# Patient Record
Sex: Female | Born: 1980 | Race: White | Hispanic: No | Marital: Married | Smoking: Never smoker
Health system: Southern US, Community
[De-identification: ages and names within clinical notes are randomized; demographics above are authoritative.]

## PROBLEM LIST (undated history)

## (undated) DIAGNOSIS — Z789 Other specified health status: Secondary | ICD-10-CM

## (undated) HISTORY — PX: OTHER SURGICAL HISTORY: SHX169

---
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
## 2000-03-23 ENCOUNTER — Encounter: Admission: RE | Admit: 2000-03-23 | Discharge: 2000-03-23 | Payer: Self-pay | Admitting: Urology

## 2000-03-23 ENCOUNTER — Encounter: Payer: Self-pay | Admitting: Urology

## 2002-02-21 ENCOUNTER — Encounter: Payer: Self-pay | Admitting: Urology

## 2002-02-21 ENCOUNTER — Ambulatory Visit (HOSPITAL_COMMUNITY): Admission: RE | Admit: 2002-02-21 | Discharge: 2002-02-21 | Payer: Self-pay | Admitting: Urology

## 2010-07-05 ENCOUNTER — Emergency Department (HOSPITAL_COMMUNITY): Admission: EM | Admit: 2010-07-05 | Discharge: 2010-07-06 | Payer: Self-pay | Admitting: Emergency Medicine

## 2010-10-28 LAB — URINALYSIS, ROUTINE W REFLEX MICROSCOPIC
Bilirubin Urine: NEGATIVE
Glucose, UA: NEGATIVE mg/dL
Ketones, ur: NEGATIVE mg/dL
Leukocytes, UA: NEGATIVE
Nitrite: NEGATIVE
Protein, ur: NEGATIVE mg/dL
Specific Gravity, Urine: 1.005 (ref 1.005–1.030)
Urobilinogen, UA: 0.2 mg/dL (ref 0.0–1.0)
pH: 6 (ref 5.0–8.0)

## 2010-10-28 LAB — URINE MICROSCOPIC-ADD ON

## 2010-10-28 LAB — POCT PREGNANCY, URINE: Preg Test, Ur: NEGATIVE

## 2012-05-30 LAB — OB RESULTS CONSOLE RPR: RPR: NONREACTIVE

## 2012-05-30 LAB — OB RESULTS CONSOLE ABO/RH: RH Type: POSITIVE

## 2012-05-30 LAB — OB RESULTS CONSOLE HIV ANTIBODY (ROUTINE TESTING): HIV: NONREACTIVE

## 2012-05-30 LAB — OB RESULTS CONSOLE RUBELLA ANTIBODY, IGM: Rubella: IMMUNE

## 2012-11-09 ENCOUNTER — Encounter (HOSPITAL_COMMUNITY): Payer: Self-pay | Admitting: Pharmacist

## 2012-11-12 NOTE — H&P (Signed)
Tami Hernandez is a 32 y.o. female G2P0010 at 36+weeks (EDD 12/09/12 by known conception with IVF/ICSI) with di/di twins presenting for scheduled c-section given a finding of IUGR in twin B of <10%ile and discordance of 28%. Dopplers and NST's have remained reassuring.  Twins are vertex transverse and patient elects c-section for malposition of twins.  Other than monitoring growth of the twins, the prenatal care has been non-eventful.  Pt d/w MFM who recommended delivery at 36-37 weeks.  Maternal Medical History:  Fetal activity: Perceived fetal activity is normal.    Prenatal complications: IUGR.   Prenatal Complications - Diabetes: none.    Past Ob Hx EAB 2007  Past Medical History  Diagnosis Date  . Medical history non-contributory    Past Surgical History  Procedure Laterality Date  . Egg harvesting     Family History: family history is not on file. Social History:  reports that she has never smoked. She does not have any smokeless tobacco history on file. She reports that she does not drink alcohol or use illicit drugs.   Prenatal Transfer Tool  Maternal Diabetes: No Genetic Screening: Declined Maternal Ultrasounds/Referrals: Abnormal:  Findings:   IUGR Fetal Ultrasounds or other Referrals:  Referred to Materal Fetal Medicine  Maternal Substance Abuse:  No Significant Maternal Medications:  None Significant Maternal Lab Results:  None Other Comments:  None  ROS    Blood pressure 136/76, pulse 98, temperature 98.4 F (36.9 C), temperature source Oral, resp. rate 18, SpO2 98.00%. Maternal Exam:  Uterine Assessment: Contraction strength is mild.  Contraction frequency is irregular.   Abdomen: Patient reports no abdominal tenderness. Introitus: Normal vulva. Normal vagina.    Physical Exam  Constitutional: She is oriented to person, place, and time. She appears well-developed and well-nourished.  Cardiovascular: Normal rate and regular rhythm.   Respiratory: Effort  normal and breath sounds normal.  GI: Soft. Bowel sounds are normal.  Genitourinary: Vagina normal.  Uterus gravid  FH=43cm  Neurological: She is alert and oriented to person, place, and time.  Psychiatric: She has a normal mood and affect. Her behavior is normal.    Prenatal labs: ABO, Rh: --/--/A POS, A POS (03/31 1040) Antibody: NEG (03/31 1040) Rubella: Immune (10/14 1431) RPR: NON REACTIVE (03/31 1040)  HBsAg: Negative (10/14 1431)  HIV: Non-reactive (10/14 1431)  GBS:   negative One hour GTT 101 Declined genetic screens  Assessment/Plan: Pt  counseled re: risks and benefits of TOL and c-section.  Pt elects c-section and risks of bleeding, infection, and possible damage to bowel and bladder reviewed with her.  Desires to proceed.    Oliver Pila 11/15/2012, 12:18 PM

## 2012-11-14 ENCOUNTER — Encounter (HOSPITAL_COMMUNITY)
Admission: RE | Admit: 2012-11-14 | Discharge: 2012-11-14 | Disposition: A | Discharge status: Home / Self Care | Payer: BC Managed Care – PPO | Source: Ambulatory Visit | Attending: Obstetrics and Gynecology | Admitting: Obstetrics and Gynecology

## 2012-11-14 ENCOUNTER — Encounter (HOSPITAL_COMMUNITY): Payer: Self-pay

## 2012-11-14 HISTORY — DX: Other specified health status: Z78.9

## 2012-11-14 LAB — CBC
HCT: 33.8 % — ABNORMAL LOW (ref 36.0–46.0)
MCV: 89.9 fL (ref 78.0–100.0)
RDW: 13.7 % (ref 11.5–15.5)
WBC: 10.3 10*3/uL (ref 4.0–10.5)

## 2012-11-14 LAB — TYPE AND SCREEN: Antibody Screen: NEGATIVE

## 2012-11-14 LAB — ABO/RH: ABO/RH(D): A POS

## 2012-11-14 NOTE — Pre-Procedure Instructions (Signed)
Platelet count of 137 called to Brayton Caves, MD. To be repeated in AM (11/15/12, day of surgery).

## 2012-11-14 NOTE — Patient Instructions (Addendum)
20 Tami Hernandez  11/14/2012   Your procedure is scheduled on:  11/15/12  Enter through the Main Entrance of [facility] at 1100 AM.  Pick up the phone at the desk and dial [phone].   Call this number if you have problems the morning of surgery: [phone]   Remember:   Do not eat food:After Midnight.  Do not drink clear liquids: After Midnight.  Take these medicines the morning of surgery with A SIP OF WATER: NA   Do not wear jewelry, make-up or nail polish.  Do not wear lotions, powders, or perfumes. You may wear deodorant.  Do not shave 48 hours prior to surgery.  Do not bring valuables to the hospital.  Contacts, dentures or bridgework may not be worn into surgery.  Leave suitcase in the car. After surgery it may be brought to your room.  For patients admitted to the hospital, checkout time is 11:00 AM the day of discharge.   Patients discharged the day of surgery will not be allowed to drive home.  Name and phone number of your driver: NA  Special Instructions: Shower using CHG 2 nights before surgery and the night before surgery.  If you shower the day of surgery use CHG.  Use special wash - you have one bottle of CHG for all showers.  You should use approximately 1/3 of the bottle for each shower.   Please read over the following fact sheets that you were given: Surgical Site Infection Prevention

## 2012-11-14 NOTE — Pre-Procedure Instructions (Signed)
Patient plans to breast feed, no option for "OB ADMIT" for this patient in EPIC (?)

## 2012-11-15 ENCOUNTER — Encounter (HOSPITAL_COMMUNITY): Payer: Self-pay | Admitting: Anesthesiology

## 2012-11-15 ENCOUNTER — Encounter (HOSPITAL_COMMUNITY)
Admission: AD | Disposition: A | Discharge status: Home / Self Care | Payer: Self-pay | Source: Ambulatory Visit | Attending: Obstetrics and Gynecology

## 2012-11-15 ENCOUNTER — Inpatient Hospital Stay (HOSPITAL_COMMUNITY)
Admission: AD | Admit: 2012-11-15 | Discharge: 2012-11-18 | DRG: 371 | Disposition: A | Discharge status: Home / Self Care | Payer: BC Managed Care – PPO | Source: Ambulatory Visit | Attending: Obstetrics and Gynecology | Admitting: Obstetrics and Gynecology

## 2012-11-15 ENCOUNTER — Inpatient Hospital Stay (HOSPITAL_COMMUNITY): Payer: BC Managed Care – PPO | Admitting: Anesthesiology

## 2012-11-15 DIAGNOSIS — O30049 Twin pregnancy, dichorionic/diamniotic, unspecified trimester: Secondary | ICD-10-CM

## 2012-11-15 DIAGNOSIS — O365990 Maternal care for other known or suspected poor fetal growth, unspecified trimester, not applicable or unspecified: Secondary | ICD-10-CM | POA: Diagnosis present

## 2012-11-15 DIAGNOSIS — O3090 Multiple gestation, unspecified, unspecified trimester: Principal | ICD-10-CM | POA: Diagnosis present

## 2012-11-15 DIAGNOSIS — Z98891 History of uterine scar from previous surgery: Secondary | ICD-10-CM

## 2012-11-15 SURGERY — Surgical Case
Anesthesia: Spinal | Wound class: Clean Contaminated

## 2012-11-15 MED ORDER — SIMETHICONE 80 MG PO CHEW
80.0000 mg | CHEWABLE_TABLET | Freq: Three times a day (TID) | ORAL | Status: DC
  Administered 2012-11-15 – 2012-11-18 (×8): 80 mg via ORAL

## 2012-11-15 MED ORDER — PRENATAL MULTIVITAMIN CH
1.0000 | ORAL_TABLET | Freq: Every day | ORAL | Status: DC
  Administered 2012-11-16 – 2012-11-18 (×3): 1 via ORAL
  Filled 2012-11-15 (×3): qty 1

## 2012-11-15 MED ORDER — ZOLPIDEM TARTRATE 5 MG PO TABS: 5.0000 mg | ORAL_TABLET | Freq: Every evening | ORAL | Status: DC | PRN

## 2012-11-15 MED ORDER — MENTHOL 3 MG MT LOZG: 1.0000 | LOZENGE | OROMUCOSAL | Status: DC | PRN

## 2012-11-15 MED ORDER — DIBUCAINE 1 % RE OINT: 1.0000 "application " | TOPICAL_OINTMENT | RECTAL | Status: DC | PRN

## 2012-11-15 MED ORDER — OXYCODONE-ACETAMINOPHEN 5-325 MG PO TABS
1.0000 | ORAL_TABLET | ORAL | Status: DC | PRN
  Administered 2012-11-16: 1 via ORAL
  Administered 2012-11-16: 2 via ORAL
  Administered 2012-11-16 (×2): 1 via ORAL
  Administered 2012-11-16 – 2012-11-18 (×6): 2 via ORAL
  Administered 2012-11-18: 1 via ORAL
  Administered 2012-11-18: 2 via ORAL
  Filled 2012-11-15 (×3): qty 2
  Filled 2012-11-15 (×2): qty 1
  Filled 2012-11-15 (×3): qty 2
  Filled 2012-11-15 (×2): qty 1
  Filled 2012-11-15: qty 2
  Filled 2012-11-15: qty 1
  Filled 2012-11-15: qty 2

## 2012-11-15 MED ORDER — DIPHENHYDRAMINE HCL 50 MG/ML IJ SOLN: 25.0000 mg | INTRAMUSCULAR | Status: DC | PRN

## 2012-11-15 MED ORDER — LACTATED RINGERS IV SOLN
INTRAVENOUS | Status: DC | PRN
  Administered 2012-11-15 (×3): via INTRAVENOUS

## 2012-11-15 MED ORDER — HYDROMORPHONE HCL PF 1 MG/ML IJ SOLN
0.2500 mg | INTRAMUSCULAR | Status: DC | PRN
  Administered 2012-11-15 (×4): 0.25 mg via INTRAVENOUS

## 2012-11-15 MED ORDER — SENNOSIDES-DOCUSATE SODIUM 8.6-50 MG PO TABS
2.0000 | ORAL_TABLET | Freq: Every day | ORAL | Status: DC
  Administered 2012-11-15 – 2012-11-17 (×3): 2 via ORAL

## 2012-11-15 MED ORDER — FENTANYL CITRATE 0.05 MG/ML IJ SOLN
INTRAMUSCULAR | Status: DC | PRN
  Administered 2012-11-15: 12.5 ug via INTRATHECAL

## 2012-11-15 MED ORDER — PHENYLEPHRINE HCL 10 MG/ML IJ SOLN
INTRAMUSCULAR | Status: DC | PRN
  Administered 2012-11-15: 40 ug via INTRAVENOUS
  Administered 2012-11-15: 80 ug via INTRAVENOUS
  Administered 2012-11-15: 120 ug via INTRAVENOUS
  Administered 2012-11-15 (×2): 40 ug via INTRAVENOUS

## 2012-11-15 MED ORDER — IBUPROFEN 600 MG PO TABS
600.0000 mg | ORAL_TABLET | Freq: Four times a day (QID) | ORAL | Status: DC
  Administered 2012-11-16 – 2012-11-18 (×9): 600 mg via ORAL
  Filled 2012-11-15 (×8): qty 1

## 2012-11-15 MED ORDER — MEPERIDINE HCL 25 MG/ML IJ SOLN
6.2500 mg | INTRAMUSCULAR | Status: DC | PRN
  Administered 2012-11-15: 6.25 mg via INTRAVENOUS

## 2012-11-15 MED ORDER — ONDANSETRON HCL 4 MG/2ML IJ SOLN: 4.0000 mg | INTRAMUSCULAR | Status: DC | PRN

## 2012-11-15 MED ORDER — LACTATED RINGERS IV SOLN
INTRAVENOUS | Status: DC | PRN
  Administered 2012-11-15: 13:00:00 via INTRAVENOUS

## 2012-11-15 MED ORDER — OXYTOCIN 10 UNIT/ML IJ SOLN
INTRAMUSCULAR | Status: DC | PRN
  Administered 2012-11-15: 40 [IU] via INTRAMUSCULAR

## 2012-11-15 MED ORDER — WITCH HAZEL-GLYCERIN EX PADS: 1.0000 "application " | MEDICATED_PAD | CUTANEOUS | Status: DC | PRN

## 2012-11-15 MED ORDER — MEPERIDINE HCL 25 MG/ML IJ SOLN
INTRAMUSCULAR | Status: AC
  Filled 2012-11-15: qty 1

## 2012-11-15 MED ORDER — ONDANSETRON HCL 4 MG/2ML IJ SOLN: 4.0000 mg | Freq: Three times a day (TID) | INTRAMUSCULAR | Status: DC | PRN

## 2012-11-15 MED ORDER — OXYTOCIN 10 UNIT/ML IJ SOLN
INTRAMUSCULAR | Status: AC
  Filled 2012-11-15: qty 4

## 2012-11-15 MED ORDER — MEPERIDINE HCL 25 MG/ML IJ SOLN: 6.2500 mg | INTRAMUSCULAR | Status: DC | PRN

## 2012-11-15 MED ORDER — OXYTOCIN 40 UNITS IN LACTATED RINGERS INFUSION - SIMPLE MED: 62.5000 mL/h | INTRAVENOUS | Status: AC

## 2012-11-15 MED ORDER — SCOPOLAMINE 1 MG/3DAYS TD PT72
MEDICATED_PATCH | TRANSDERMAL | Status: AC
  Administered 2012-11-15: 1.5 mg via TRANSDERMAL
  Filled 2012-11-15: qty 1

## 2012-11-15 MED ORDER — KETOROLAC TROMETHAMINE 30 MG/ML IJ SOLN
30.0000 mg | Freq: Four times a day (QID) | INTRAMUSCULAR | Status: AC | PRN
  Administered 2012-11-15 – 2012-11-16 (×2): 30 mg via INTRAVENOUS
  Filled 2012-11-15 (×2): qty 1

## 2012-11-15 MED ORDER — KETOROLAC TROMETHAMINE 60 MG/2ML IM SOLN
60.0000 mg | Freq: Once | INTRAMUSCULAR | Status: AC | PRN
  Administered 2012-11-15: 60 mg via INTRAMUSCULAR

## 2012-11-15 MED ORDER — CEFAZOLIN SODIUM-DEXTROSE 2-3 GM-% IV SOLR
INTRAVENOUS | Status: AC
  Filled 2012-11-15: qty 50

## 2012-11-15 MED ORDER — PHENYLEPHRINE 40 MCG/ML (10ML) SYRINGE FOR IV PUSH (FOR BLOOD PRESSURE SUPPORT)
PREFILLED_SYRINGE | INTRAVENOUS | Status: AC
  Filled 2012-11-15: qty 5

## 2012-11-15 MED ORDER — ONDANSETRON HCL 4 MG/2ML IJ SOLN
INTRAMUSCULAR | Status: DC | PRN
  Administered 2012-11-15: 4 mg via INTRAVENOUS

## 2012-11-15 MED ORDER — NALBUPHINE HCL 10 MG/ML IJ SOLN
5.0000 mg | INTRAMUSCULAR | Status: DC | PRN
  Filled 2012-11-15: qty 1

## 2012-11-15 MED ORDER — MORPHINE SULFATE 0.5 MG/ML IJ SOLN
INTRAMUSCULAR | Status: AC
  Filled 2012-11-15: qty 10

## 2012-11-15 MED ORDER — KETOROLAC TROMETHAMINE 30 MG/ML IJ SOLN: 15.0000 mg | Freq: Once | INTRAMUSCULAR | Status: DC | PRN

## 2012-11-15 MED ORDER — BUPIVACAINE HCL (PF) 0.75 % IJ SOLN
INTRAMUSCULAR | Status: DC | PRN
Start: 2012-11-15 — End: 2012-11-15
  Administered 2012-11-15: 1.66 mL via INTRATHECAL

## 2012-11-15 MED ORDER — DIPHENHYDRAMINE HCL 25 MG PO CAPS
25.0000 mg | ORAL_CAPSULE | ORAL | Status: DC | PRN
  Filled 2012-11-15: qty 1

## 2012-11-15 MED ORDER — MORPHINE SULFATE (PF) 0.5 MG/ML IJ SOLN
INTRAMUSCULAR | Status: DC | PRN
  Administered 2012-11-15: .1 mg via INTRATHECAL

## 2012-11-15 MED ORDER — SODIUM CHLORIDE 0.9 % IJ SOLN: 3.0000 mL | INTRAMUSCULAR | Status: DC | PRN

## 2012-11-15 MED ORDER — DIPHENHYDRAMINE HCL 50 MG/ML IJ SOLN: 12.5000 mg | INTRAMUSCULAR | Status: DC | PRN

## 2012-11-15 MED ORDER — METOCLOPRAMIDE HCL 5 MG/ML IJ SOLN: 10.0000 mg | Freq: Three times a day (TID) | INTRAMUSCULAR | Status: DC | PRN

## 2012-11-15 MED ORDER — LACTATED RINGERS IV SOLN: INTRAVENOUS | Status: DC

## 2012-11-15 MED ORDER — FENTANYL CITRATE 0.05 MG/ML IJ SOLN
INTRAMUSCULAR | Status: AC
  Filled 2012-11-15: qty 2

## 2012-11-15 MED ORDER — ONDANSETRON HCL 4 MG/2ML IJ SOLN: 4.0000 mg | Freq: Once | INTRAMUSCULAR | Status: DC | PRN

## 2012-11-15 MED ORDER — SCOPOLAMINE 1 MG/3DAYS TD PT72: 1.0000 | MEDICATED_PATCH | Freq: Once | TRANSDERMAL | Status: DC

## 2012-11-15 MED ORDER — ONDANSETRON HCL 4 MG PO TABS: 4.0000 mg | ORAL_TABLET | ORAL | Status: DC | PRN

## 2012-11-15 MED ORDER — HYDROMORPHONE HCL PF 1 MG/ML IJ SOLN
INTRAMUSCULAR | Status: AC
  Filled 2012-11-15: qty 1

## 2012-11-15 MED ORDER — SIMETHICONE 80 MG PO CHEW
80.0000 mg | CHEWABLE_TABLET | ORAL | Status: DC | PRN
  Administered 2012-11-16 (×2): 80 mg via ORAL

## 2012-11-15 MED ORDER — ONDANSETRON HCL 4 MG/2ML IJ SOLN
INTRAMUSCULAR | Status: AC
  Filled 2012-11-15: qty 2

## 2012-11-15 MED ORDER — DEXTROSE 5 % IV SOLN
1.0000 ug/kg/h | INTRAVENOUS | Status: DC | PRN
  Filled 2012-11-15: qty 2

## 2012-11-15 MED ORDER — HYDROMORPHONE HCL PF 1 MG/ML IJ SOLN
1.0000 mg | Freq: Once | INTRAMUSCULAR | Status: AC
  Administered 2012-11-15: 1 mg via INTRAMUSCULAR
  Filled 2012-11-15: qty 1

## 2012-11-15 MED ORDER — NALOXONE HCL 0.4 MG/ML IJ SOLN: 0.4000 mg | INTRAMUSCULAR | Status: DC | PRN

## 2012-11-15 MED ORDER — DIPHENHYDRAMINE HCL 25 MG PO CAPS: 25.0000 mg | ORAL_CAPSULE | Freq: Four times a day (QID) | ORAL | Status: DC | PRN

## 2012-11-15 MED ORDER — KETOROLAC TROMETHAMINE 30 MG/ML IJ SOLN: 30.0000 mg | Freq: Four times a day (QID) | INTRAMUSCULAR | Status: AC | PRN

## 2012-11-15 MED ORDER — 0.9 % SODIUM CHLORIDE (POUR BTL) OPTIME
TOPICAL | Status: DC | PRN
  Administered 2012-11-15: 1000 mL

## 2012-11-15 MED ORDER — CEFAZOLIN SODIUM-DEXTROSE 2-3 GM-% IV SOLR
2.0000 g | Freq: Once | INTRAVENOUS | Status: AC
  Administered 2012-11-15: 2 g via INTRAVENOUS

## 2012-11-15 MED ORDER — LANOLIN HYDROUS EX OINT: 1.0000 "application " | TOPICAL_OINTMENT | CUTANEOUS | Status: DC | PRN

## 2012-11-15 MED ORDER — KETOROLAC TROMETHAMINE 60 MG/2ML IM SOLN
INTRAMUSCULAR | Status: AC
  Filled 2012-11-15: qty 2

## 2012-11-15 MED ORDER — TETANUS-DIPHTH-ACELL PERTUSSIS 5-2.5-18.5 LF-MCG/0.5 IM SUSP: 0.5000 mL | Freq: Once | INTRAMUSCULAR | Status: DC

## 2012-11-15 MED ORDER — LACTATED RINGERS IV SOLN
Freq: Once | INTRAVENOUS | Status: AC
  Administered 2012-11-15: 11:00:00 via INTRAVENOUS

## 2012-11-15 SURGICAL SUPPLY — 36 items
APL SKNCLS STERI-STRIP NONHPOA (GAUZE/BANDAGES/DRESSINGS) ×1
BENZOIN TINCTURE PRP APPL 2/3 (GAUZE/BANDAGES/DRESSINGS) ×1 IMPLANT
CLOTH BEACON ORANGE TIMEOUT ST (SAFETY) ×2 IMPLANT
CONTAINER PREFILL 10% NBF 15ML (MISCELLANEOUS) IMPLANT
DRAPE LG THREE QUARTER DISP (DRAPES) ×2 IMPLANT
DRSG OPSITE POSTOP 4X10 (GAUZE/BANDAGES/DRESSINGS) ×2 IMPLANT
DURAPREP 26ML APPLICATOR (WOUND CARE) ×2 IMPLANT
ELECT REM PT RETURN 9FT ADLT (ELECTROSURGICAL) ×2
ELECTRODE REM PT RTRN 9FT ADLT (ELECTROSURGICAL) ×1 IMPLANT
EXTRACTOR VACUUM KIWI (MISCELLANEOUS) IMPLANT
EXTRACTOR VACUUM M CUP 4 TUBE (SUCTIONS) IMPLANT
GLOVE BIO SURGEON STRL SZ 6.5 (GLOVE) ×2 IMPLANT
GLOVE NEODERM STER SZ 7 (GLOVE) ×2 IMPLANT
GOWN STRL REIN XL XLG (GOWN DISPOSABLE) ×4 IMPLANT
GOWN SURGICAL LARGE (GOWNS) ×2 IMPLANT
KIT ABG SYR 3ML LUER SLIP (SYRINGE) IMPLANT
NDL HYPO 25X5/8 SAFETYGLIDE (NEEDLE) IMPLANT
NEEDLE HYPO 25X5/8 SAFETYGLIDE (NEEDLE) IMPLANT
NS IRRIG 1000ML POUR BTL (IV SOLUTION) ×2 IMPLANT
PACK C SECTION WH (CUSTOM PROCEDURE TRAY) ×2 IMPLANT
PAD OB MATERNITY 4.3X12.25 (PERSONAL CARE ITEMS) ×2 IMPLANT
RTRCTR C-SECT PINK 25CM LRG (MISCELLANEOUS) ×2 IMPLANT
SLEEVE SCD COMPRESS KNEE MED (MISCELLANEOUS) IMPLANT
STAPLER VISISTAT 35W (STAPLE) IMPLANT
STRIP CLOSURE SKIN 1/2X4 (GAUZE/BANDAGES/DRESSINGS) ×1 IMPLANT
SUT CHROMIC 1 CTX 36 (SUTURE) ×4 IMPLANT
SUT PLAIN 0 NONE (SUTURE) IMPLANT
SUT PLAIN 2 0 XLH (SUTURE) IMPLANT
SUT VIC AB 0 CT1 27 (SUTURE) ×4
SUT VIC AB 0 CT1 27XBRD ANBCTR (SUTURE) ×2 IMPLANT
SUT VIC AB 2-0 CT1 27 (SUTURE)
SUT VIC AB 2-0 CT1 TAPERPNT 27 (SUTURE) IMPLANT
SUT VIC AB 4-0 KS 27 (SUTURE) IMPLANT
TOWEL OR 17X24 6PK STRL BLUE (TOWEL DISPOSABLE) ×6 IMPLANT
TRAY FOLEY CATH 14FR (SET/KITS/TRAYS/PACK) ×2 IMPLANT
WATER STERILE IRR 1000ML POUR (IV SOLUTION) ×2 IMPLANT

## 2012-11-15 NOTE — Anesthesia Postprocedure Evaluation (Signed)
Anesthesia Post Note  Patient: Tami Hernandez  Procedure(s) Performed: Procedure(s) (LRB): CESAREAN SECTION, twins  (N/A)  Anesthesia type: Spinal  Patient location: PACU  Post pain: Pain level controlled  Post assessment: Post-op Vital signs reviewed  Last Vitals:  Filed Vitals:   11/15/12 1116  BP: 136/76  Pulse:   Temp:   Resp:     Post vital signs: Reviewed  Level of consciousness: awake  Complications: No apparent anesthesia complications

## 2012-11-15 NOTE — Lactation Note (Signed)
This note was copied from the chart of Tami Hernandez. Lactation Consultation Note  Patient Name: Tami Hernandez Today's Date: 11/15/2012 Reason for consult: Initial assessment;Late preterm infant;Multiple gestation  In PACU, baby sleepy and would not latch. Kept STS. See note for Baby B. Maternal Data Formula Feeding for Exclusion: No Infant to breast within first hour of birth: No Breastfeeding delayed due to:: Maternal status Has patient been taught Hand Expression?: Yes Does the patient have breastfeeding experience prior to this delivery?: No  Feeding Feeding Type: Breast Milk Feeding method: Breast Length of feed: 0 min  LATCH Score/Interventions Latch: Too sleepy or reluctant, no latch achieved, no sucking elicited.                    Lactation Tools Discussed/Used     Consult Status Consult Status: Follow-up Date: 11/15/12 Follow-up type: In-patient    Alfred Levins 11/15/2012, 2:55 PM

## 2012-11-15 NOTE — Anesthesia Preprocedure Evaluation (Signed)
Anesthesia Evaluation  Patient identified by MRN, date of birth, ID band Patient awake    Reviewed: Allergy & Precautions, H&P , NPO status , Patient's Chart, lab work & pertinent test results  Airway Mallampati: II TM Distance: >3 FB Neck ROM: full    Dental no notable dental hx.    Pulmonary neg pulmonary ROS,    Pulmonary exam normal       Cardiovascular negative cardio ROS      Neuro/Psych negative neurological ROS  negative psych ROS   GI/Hepatic negative GI ROS, Neg liver ROS,   Endo/Other  negative endocrine ROS  Renal/GU negative Renal ROS  negative genitourinary   Musculoskeletal negative musculoskeletal ROS (+)   Abdominal Normal abdominal exam  (+)   Peds negative pediatric ROS (+)  Hematology negative hematology ROS (+)   Anesthesia Other Findings   Reproductive/Obstetrics (+) Pregnancy                           Anesthesia Physical Anesthesia Plan  ASA: II  Anesthesia Plan: Spinal   Post-op Pain Management:    Induction:   Airway Management Planned:   Additional Equipment:   Intra-op Plan:   Post-operative Plan:   Informed Consent: I have reviewed the patients History and Physical, chart, labs and discussed the procedure including the risks, benefits and alternatives for the proposed anesthesia with the patient or authorized representative who has indicated his/her understanding and acceptance.     Plan Discussed with: CRNA and Surgeon  Anesthesia Plan Comments:         Anesthesia Quick Evaluation  

## 2012-11-15 NOTE — Anesthesia Procedure Notes (Signed)
Spinal  Patient location during procedure: OR Start time: 11/15/2012 12:28 PM End time: 11/15/2012 12:31 PM Staffing Anesthesiologist: Sandrea Hughs Performed by: anesthesiologist  Preanesthetic Checklist Completed: patient identified, site marked, surgical consent, pre-op evaluation, timeout performed, IV checked, risks and benefits discussed and monitors and equipment checked Spinal Block Patient position: sitting Prep: DuraPrep Patient monitoring: heart rate, cardiac monitor, continuous pulse ox and blood pressure Approach: midline Location: L3-4 Injection technique: single-shot Needle Needle type: Sprotte  Needle gauge: 24 G Needle length: 9 cm Needle insertion depth: 7 cm Assessment Sensory level: T4

## 2012-11-15 NOTE — Transfer of Care (Signed)
Immediate Anesthesia Transfer of Care Note  Patient: Tami Hernandez  Procedure(s) Performed: Procedure(s) with comments: CESAREAN SECTION, twins  (N/A) - primary  Patient Location: PACU  Anesthesia Type:Spinal  Level of Consciousness: awake  Airway & Oxygen Therapy: Patient Spontanous Breathing  Post-op Assessment: Report given to PACU RN  Post vital signs: Reviewed and stable  Complications: No apparent anesthesia complications

## 2012-11-15 NOTE — Op Note (Signed)
Operative report  Preoperative diagnosis Preterm pregnancy at 36-3/[redacted] weeks gestation Twin pregnancy Discordant growth of twins with IUGR of twin B Vertex/ transverse lie  Postoperative diagnosis Same  Procedure Primary low transverse cesarean section with 2 layer closure of uterus  Surgeon Dr. Huel Cote Dr. Tracey Harries  Anesthesia Spinal  Fluids Estimated blood loss 1000 cc Urine output 200 cc clear urine IV fluids 2200 cc LR  Findings Twin A was in the vertex presentation, a viable female infant with Apgars of 8 and 9. Weight pending at the time of dictation.  Twin B was a transverse lie with the head in the upper right fundus, delivered breech with Apgars 9 and 9. Weight pending at time of dictation. The ovaries and tubes and uterus appeared normal.    Specimen Placenta is were sent to pathology with a clamp on the cord of twin A   Procedure note After informed consent was obtained from the patient she was taken to the operating room where spinal anesthesia was obtained without difficulty. She was prepped and draped in the normal sterile fashion in the dorsal supine position with a leftward tilt. An appropriate time out was performed. A Pfannenstiel skin incision was then made with the scalpel and carried through to the underlying layer of fascia by sharp dissection and Bovie cautery. The fascia was nicked in the midline and the incision extended laterally with Mayo scissors. The inferior aspect of the incision was grasped with Coker clamps elevated and dissected off the underlying rectus muscles. In a similar fashion the superior aspect was dissected off the rectus muscles. The rectus muscles were separated midline and the peritoneal cavity entered bluntly. Peritoneal incision was then extended both superiorly and inferiorly with careful attention to avoid both bowel and bladder. The Alexis self-retaining wound retractor was then placed within the incision and the lower  uterine segment exposed nicely. The bladder flap was created with Metzenbaum scissors and the bladder pushed away from the lower segment. The lower uterine segment was then incised in a transverse fashion and the cavity itself entered bluntly. Clear fluid was noted and twin A was delivered from the vertex presentation without difficulty nose and mouth were bulb suctioned and the cord was clamped and cut with the infant handed to the waiting pediatricians. He scored was marked with a cord clamp. The position of twin B was then assessed through the membranes and the vertex was found to be high in the maternal right fundus with the feet to the left. The feet were grasped and the membranes ruptured and with gentle traction the baby was delivered in the breech presentation the arms were reduced over the chest and head delivered in a flexed position. Nose and mouth were bulb suctioned and the cord clamped and cut with the infant handed to the waiting pediatricians. This point the placentas were spontaneously expressed from the uterus and handed off to pathology the uterus was cleared of all clots and debris with moist lap sponge. There was some moderate atony which responded to Pitocin and bimanual massage. The incision was then closed in 2 layers the first a running locked layer of 1-0 chromic and the second an imbricating layer of the same suture. The left angle was noted to have some bleeding and this was reinforced with a 0 Vicryl in a figure-of-eight suture. 2 other smaller areas of bleeding along the incision line were reinforced with 3-0 Vicryl in figure-of-eight sutures. The incision then appeared hemostatic and the tubes  and ovaries were inspected and the gutters cleared of all clots and debris. All instruments and sponges were then removed from the abdomen as well as the Alexis retractor. The rectus muscles and peritoneum were then reapproximated with several interrupted mattress sutures of 2-0 Vicryl the fascia  was closed with 0 Vicryl in a running fashion. Subcutaneous tissue was reapproximated with 30 plain in a running fashion. The skin was closed with a subcuticular stitch of 4-0 Vicryl on a Keith needle. Again all instruments and sponge counts were correct and the patient was taken to the recovery room with both babies accompanying her and doing well.

## 2012-11-15 NOTE — Lactation Note (Signed)
This note was copied from the chart of Tami Cereniti Livingood. Lactation Consultation Note  Patient Name: Tami Hernandez Today's Date: 11/15/2012 Reason for consult: Initial assessment;Infant < 6lbs;Late preterm infant;Multiple gestation Called to PACU to assist with Breastfeeding. Baby boy giving feeding ques, low CBG. Massage and hand expression demonstrated to Mom, colostrum present. Baby having difficulty sustaining a latch, requiring re-latching throughout the feeding. Baby nursed off and on for greater than 30 minutes. Colostrum hand expressed in baby's mouth. Baby STS while BF. Discussed late preterm behaviors. Advised parents to BF with feeding ques but if they do not observe feeding ques by 3 hours from last feeding then place baby STS and attempt to BF. Lactation brochure left for review. Advised of OP services and support group. Advised to ask for assist with latching her babies.   Maternal Data Formula Feeding for Exclusion: No Infant to breast within first hour of birth: No Breastfeeding delayed due to:: Maternal status Has patient been taught Hand Expression?: Yes Does the patient have breastfeeding experience prior to this delivery?: No  Feeding Feeding Type: Breast Milk Feeding method: Breast  LATCH Score/Interventions Latch: Repeated attempts needed to sustain latch, nipple held in mouth throughout feeding, stimulation needed to elicit sucking reflex. Intervention(s): Adjust position;Assist with latch;Breast massage;Breast compression  Audible Swallowing: None  Type of Nipple: Everted at rest and after stimulation  Comfort (Breast/Nipple): Soft / non-tender     Hold (Positioning): Full assist, staff holds infant at breast  LATCH Score: 5  Lactation Tools Discussed/Used     Consult Status Consult Status: Follow-up Date: 11/15/12 Follow-up type: In-patient    Tami Hernandez 11/15/2012, 2:50 PM    

## 2012-11-15 NOTE — Brief Op Note (Signed)
11/15/2012  1:46 PM  PATIENT:  Tami Hernandez  32 y.o. female  PRE-OPERATIVE DIAGNOSIS:  twins vertex/transverse  Discordant growth with twin B intra uterine growth retardation [phone]  POST-OPERATIVE DIAGNOSIS:  twins vertex/transverse  Discordant growth with twin B intra uterine growth retardation   PROCEDURE:  Procedure(s) with comments: CESAREAN SECTION, twins  (N/A) - primary with 2 layer closure of uterus  SURGEON:  Surgeon(s) and Role:    * Oliver Pila, MD - Primary      Tracey Harries, assist  ANESTHESIA:   spinal  EBL:  Total I/O In: 3200 [I.V.:3200] Out: 1275 [Urine:275; Blood:1000]  BLOOD ADMINISTERED:none  DRAINS: Urinary Catheter (Foley)   LOCAL MEDICATIONS USED:  NONE  SPECIMEN: placenta  DISPOSITION OF SPECIMEN:  PATHOLOGY  COUNTS:  YES  TOURNIQUET:  * No tourniquets in log *  DICTATION: .Dragon Dictation  PLAN OF CARE: Admit to inpatient   PATIENT DISPOSITION:  PACU - hemodynamically stable.

## 2012-11-16 ENCOUNTER — Encounter (HOSPITAL_COMMUNITY): Payer: Self-pay | Admitting: Obstetrics and Gynecology

## 2012-11-16 LAB — CBC
Hemoglobin: 9.6 g/dL — ABNORMAL LOW (ref 12.0–15.0)
MCH: 31.2 pg (ref 26.0–34.0)
MCHC: 34.8 g/dL (ref 30.0–36.0)

## 2012-11-16 NOTE — Progress Notes (Signed)
Subjective: Postpartum Day 1 Cesarean Delivery Patient reports incisional pain and tolerating PO.  Working on breastfeeding  Objective: Vital signs in last 24 hours: Temp:  [97.5 F (36.4 C)-98.4 F (36.9 C)] 98.4 F (36.9 C) (04/02 0555) Pulse Rate:  [61-98] 80 (04/02 0555) Resp:  [14-23] 18 (04/02 0555) BP: (100-140)/(52-91) 114/68 mmHg (04/02 0555) SpO2:  [95 %-98 %] 97 % (04/02 0530) Weight:  [100.245 kg (221 lb)-100.699 kg (222 lb)] 100.245 kg (221 lb) (04/01 1547)  Physical Exam:  General: alert and cooperative Lochia: appropriate Uterine Fundus: firm Incision: C/D/I    Recent Labs  11/14/12 1040 11/16/12 0243  HGB 11.3* 9.6*  HCT 33.8* 27.6*    Assessment/Plan: Status post Cesarean section. Doing well postoperatively.  Continue current care.  Oliver Pila 11/16/2012, 9:08 AM

## 2012-11-16 NOTE — Anesthesia Postprocedure Evaluation (Signed)
Anesthesia Post Note  Patient: Tami Hernandez  Procedure(s) Performed: Procedure(s) (LRB): CESAREAN SECTION, twins  (N/A)  Anesthesia type: Spinal  Patient location: Mother/Baby  Post pain: Pain level controlled  Post assessment: Post-op Vital signs reviewed  Last Vitals:  Filed Vitals:   11/16/12 1350  BP: 114/73  Pulse: 69  Temp: 36.7 C  Resp: 20    Post vital signs: Reviewed  Level of consciousness: awake  Complications: No apparent anesthesia complications

## 2012-11-17 DIAGNOSIS — Z98891 History of uterine scar from previous surgery: Secondary | ICD-10-CM | POA: Diagnosis not present

## 2012-11-17 LAB — CBC
MCH: 29.9 pg (ref 26.0–34.0)
MCV: 90.9 fL (ref 78.0–100.0)
Platelets: 109 10*3/uL — ABNORMAL LOW (ref 150–400)
RDW: 14 % (ref 11.5–15.5)
WBC: 13 10*3/uL — ABNORMAL HIGH (ref 4.0–10.5)

## 2012-11-17 NOTE — Progress Notes (Signed)
Subjective: Postpartum Day 2 Cesarean Delivery Patient reports tolerating PO and no problems voiding.  Still working hard on breastfeeding  Objective: Vital signs in last 24 hours: Temp:  [98 F (36.7 C)-98.3 F (36.8 C)] 98.1 F (36.7 C) (04/03 0548) Pulse Rate:  [61-84] 61 (04/03 0548) Resp:  [18-20] 18 (04/03 0548) BP: (112-121)/(65-79) 121/79 mmHg (04/03 0548) SpO2:  [97 %] 97 % (04/02 1350)  Physical Exam:  General: alert and cooperative Lochia: appropriate Uterine Fundus: firm Incision: C/D/I    Recent Labs  11/16/12 0243 11/17/12 0600  HGB 9.6* 8.9*  HCT 27.6* 27.1*    Assessment/Plan: Status post Cesarean section. Doing well postoperatively.  Continue current care. Plan d/c tomorrow,  D/w pt circumcision and we will do tomorrow since breastfeeding still tenuous Fredonia Casalino W 11/17/2012, 10:20 AM

## 2012-11-18 ENCOUNTER — Encounter (HOSPITAL_COMMUNITY): Payer: Self-pay | Admitting: *Deleted

## 2012-11-18 MED ORDER — IBUPROFEN 600 MG PO TABS: 600.0000 mg | ORAL_TABLET | Freq: Four times a day (QID) | ORAL | Status: AC

## 2012-11-18 MED ORDER — OXYCODONE-ACETAMINOPHEN 5-325 MG PO TABS: 1.0000 | ORAL_TABLET | ORAL | Status: DC | PRN

## 2012-11-18 NOTE — Progress Notes (Signed)
Subjective: Postpartum Day 3: Cesarean Delivery Patient reports tolerating PO and no problems voiding.    Objective: Vital signs in last 24 hours: Temp:  [98.2 F (36.8 C)-98.4 F (36.9 C)] 98.4 F (36.9 C) (04/04 0607) Pulse Rate:  [67-72] 72 (04/04 0607) Resp:  [18] 18 (04/04 0607) BP: (125-139)/(82-87) 139/82 mmHg (04/04 1610)  Physical Exam:  General: alert and cooperative Lochia: appropriate Uterine Fundus: firm Incision: C/D/I }   Recent Labs  11/16/12 0243 11/17/12 0600  HGB 9.6* 8.9*  HCT 27.6* 27.1*    Assessment/Plan: Status post Cesarean section. Doing well postoperatively.  Discharge home with standard precautions and return to office in 2 week sfor incision check.Marland Kitchen  Oliver Pila 11/18/2012, 8:52 AM

## 2012-11-18 NOTE — Discharge Summary (Signed)
Obstetric Discharge Summary Reason for Admission: cesarean section Prenatal Procedures: none Intrapartum Procedures: cesarean: low cervical, transverse Postpartum Procedures: none Complications-Operative and Postpartum: none Hemoglobin  Date Value Range Status  11/17/2012 8.9* 12.0 - 15.0 g/dL Final     HCT  Date Value Range Status  11/17/2012 27.1* 36.0 - 46.0 % Final    Physical Exam:  General: alert and cooperative Lochia: appropriate Uterine Fundus: firm Incision: healing well, no significant drainage   Discharge Diagnoses: Preterm twin delivery at 36+ weeks                             Discordant twins  Discharge Information: Date: 11/18/2012 Activity: pelvic rest Diet: routine Medications: Ibuprofen and Percocet Condition: improved Instructions: refer to practice specific booklet Discharge to: home Follow-up Information   Follow up with Oliver Pila, MD. Schedule an appointment as soon as possible for a visit in 2 weeks. (incison check)    Contact information:   [address], [address] [place] [address] [phone]       Newborn Data:   Zayra, Devito [[id number]]  Live born female  Birth Weight: 6 lb 9.8 oz (3000 g) APGAR: 8, [address]   Devera, Englander [[id number]]  Live born female  Birth Weight: 4 lb 14.7 oz (2230 g) APGAR: 9, 9  Home with mother.  Oliver Pila 11/18/2012, 9:22 AM

## 2014-06-18 ENCOUNTER — Encounter (HOSPITAL_COMMUNITY): Payer: Self-pay | Admitting: *Deleted

## 2016-10-19 ENCOUNTER — Encounter (INDEPENDENT_AMBULATORY_CARE_PROVIDER_SITE_OTHER): Payer: Self-pay | Admitting: Orthopaedic Surgery

## 2016-10-19 ENCOUNTER — Ambulatory Visit (INDEPENDENT_AMBULATORY_CARE_PROVIDER_SITE_OTHER): Payer: Self-pay

## 2016-10-19 ENCOUNTER — Ambulatory Visit (INDEPENDENT_AMBULATORY_CARE_PROVIDER_SITE_OTHER): Payer: BC Managed Care – PPO | Admitting: Orthopaedic Surgery

## 2016-10-19 DIAGNOSIS — M25551 Pain in right hip: Secondary | ICD-10-CM | POA: Diagnosis not present

## 2016-10-19 MED ORDER — BUPIVACAINE HCL 0.5 % IJ SOLN
3.0000 mL | INTRAMUSCULAR | Status: AC | PRN
  Administered 2016-10-19: 3 mL via INTRA_ARTICULAR

## 2016-10-19 MED ORDER — LIDOCAINE HCL 1 % IJ SOLN
3.0000 mL | INTRAMUSCULAR | Status: AC | PRN
  Administered 2016-10-19: 3 mL

## 2016-10-19 MED ORDER — METHOCARBAMOL 500 MG PO TABS: 500.0000 mg | ORAL_TABLET | Freq: Four times a day (QID) | ORAL | 2 refills | Status: DC | PRN

## 2016-10-19 MED ORDER — METHYLPREDNISOLONE ACETATE 40 MG/ML IJ SUSP
40.0000 mg | INTRAMUSCULAR | Status: AC | PRN
  Administered 2016-10-19: 40 mg via INTRA_ARTICULAR

## 2016-10-19 MED ORDER — DICLOFENAC SODIUM 75 MG PO TBEC: 75.0000 mg | DELAYED_RELEASE_TABLET | Freq: Two times a day (BID) | ORAL | 2 refills | Status: DC

## 2016-10-19 NOTE — Progress Notes (Signed)
Office Visit Note   Patient: Tami Hernandez           Date of Birth: November 17, 1980           MRN: [id number] Visit Date: 10/19/2016              Requested by: Sigmund HazelLisa Miller, MD [address] [place], [place] [address] PCP: Neldon LabellaMILLER,LISA LYNN, MD   Assessment & Plan: Visit Diagnoses:  1. 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 Injection was provided today under sterile conditions patient had good relief. I gave her prescription to physical therapy with Lowella PettiesJohn O'Holleran. Diclofenac and Robaxin prescribed as needed. Follow-up with me as needed.  Follow-Up Instructions: Return if symptoms worsen or fail to improve.   Orders:  Orders Placed This Encounter  Procedures  . XR HIP UNILAT W OR W/O PELVIS 2-3 VIEWS RIGHT   Meds ordered this encounter  Medications  . diclofenac (VOLTAREN) 75 MG EC tablet    Sig: Take 1 tablet (75 mg total) by mouth 2 (two) times daily.    Dispense:  30 tablet    Refill:  2  . methocarbamol (ROBAXIN) 500 MG tablet    Sig: Take 1 tablet (500 mg total) by mouth every 6 (six) hours as needed for muscle spasms.    Dispense:  30 tablet    Refill:  2      Procedures: Large Joint Inj Date/Time: 10/19/2016 11:55 AM Performed by: Tarry KosXU, NAIPING M Authorized by: Tarry KosXU, NAIPING M   Consent Given by:  Patient Timeout: prior to procedure the correct patient, procedure, and site was verified   Indications:  Pain Location:  Hip Site:  R greater trochanter Prep: patient was prepped and draped in usual sterile fashion   Needle Size:  22 G Approach:  Lateral Ultrasound Guidance: No   Fluoroscopic Guidance: No   Arthrogram: No   Medications:  3 mL lidocaine 1 %; 3 mL bupivacaine 0.5 %; 40 mg methylPREDNISolone acetate 40 MG/ML     Clinical Data: No additional findings.   Subjective: Chief Complaint  Patient presents with  . Right Hip - Pain    Tami Hernandez is a 36 year old female with right hip pain that  radiates down the lateral aspect down into the knee. She denies any radicular symptoms. Denies any groin pain. Yoga helps some. Ibuprofen does not help. The pain is gotten worse over the last couple of weeks.    Review of Systems  Constitutional: Negative.   HENT: Negative.   Eyes: Negative.   Respiratory: Negative.   Cardiovascular: Negative.   Endocrine: Negative.   Musculoskeletal: Negative.   Neurological: Negative.   Hematological: Negative.   Psychiatric/Behavioral: Negative.   All other systems reviewed and are negative.    Objective: Vital Signs: There were no vitals taken for this visit.  Physical Exam  Constitutional: She is oriented to person, place, and time. She appears well-developed and well-nourished.  HENT:  Head: Normocephalic and atraumatic.  Eyes: EOM are normal.  Neck: Neck supple.  Pulmonary/Chest: Effort normal.  Abdominal: Soft.  Neurological: She is alert and oriented to person, place, and time.  Skin: Skin is warm. Capillary refill takes less than 2 seconds.  Psychiatric: She has a normal mood and affect. Her behavior is normal. Judgment and thought content normal.  Nursing note and vitals reviewed.   Ortho Exam Exam of the right hip shows good range of motion with  pain on the lateral aspect of the hip with external rotation. SI joint is very mildly tender. Negative straight leg raise test negative Stinchfield sign.  Specialty Comments:  No specialty comments available.  Imaging: Xr Hip Unilat W Or W/o Pelvis 2-3 Views Right  Result Date: 10/19/2016 No degenerative changes. Mild right hip dysplasia    PMFS History: Patient Active Problem List   Diagnosis Date Noted  . S/P primary low transverse C-section 11/17/2012   Past Medical History:  Diagnosis Date  . Medical history non-contributory     No family history on file.  Past Surgical History:  Procedure Laterality Date  . CESAREAN SECTION N/A 11/15/2012   Procedure: CESAREAN  SECTION, twins ;  Surgeon: Oliver Pila, MD;  Location: [facility];  Service: Obstetrics;  Laterality: N/A;  primary  . egg harvesting     Social History   Occupational History  . Not on file.   Social History Main Topics  . Smoking status: Never Smoker  . Smokeless tobacco: Never Used  . Alcohol use No  . Drug use: No  . Sexual activity: Not on file

## 2017-04-09 ENCOUNTER — Ambulatory Visit (INDEPENDENT_AMBULATORY_CARE_PROVIDER_SITE_OTHER): Payer: BC Managed Care – PPO | Admitting: Orthopaedic Surgery

## 2017-04-09 ENCOUNTER — Encounter (INDEPENDENT_AMBULATORY_CARE_PROVIDER_SITE_OTHER): Payer: Self-pay | Admitting: Orthopaedic Surgery

## 2017-04-09 DIAGNOSIS — M25551 Pain in right hip: Secondary | ICD-10-CM | POA: Diagnosis not present

## 2017-04-09 MED ORDER — LIDOCAINE HCL 1 % IJ SOLN
3.0000 mL | INTRAMUSCULAR | Status: AC | PRN
  Administered 2017-04-09: 3 mL

## 2017-04-09 MED ORDER — BUPIVACAINE HCL 0.5 % IJ SOLN
3.0000 mL | INTRAMUSCULAR | Status: AC | PRN
  Administered 2017-04-09: 3 mL via INTRA_ARTICULAR

## 2017-04-09 MED ORDER — METHYLPREDNISOLONE ACETATE 40 MG/ML IJ SUSP
40.0000 mg | INTRAMUSCULAR | Status: AC | PRN
  Administered 2017-04-09: 40 mg via INTRA_ARTICULAR

## 2017-04-09 NOTE — Progress Notes (Signed)
   Office Visit Note   Patient: Tami Hernandez           Date of Birth: Mar 31, 1981           MRN: [id number] Visit Date: 04/09/2017              Requested by: Sigmund Hazel, MD [address] [place], [place] [address] PCP: Sigmund Hazel, MD   Assessment & Plan: Visit Diagnoses:  1. Pain in right hip     Plan: Right trochanteric bursa injection was performed today. Patient tolerates well. Continue with home exercises. If this flares back up need MRI to rule out tear.  Follow-Up Instructions: Return if symptoms worsen or fail to improve.   Orders:  No orders of the defined types were placed in this encounter.  No orders of the defined types were placed in this encounter.     Procedures: Large Joint Inj Date/Time: 04/09/2017 11:09 AM Performed by: Tarry Kos Authorized by: Tarry Kos   Consent Given by:  Patient Timeout: prior to procedure the correct patient, procedure, and site was verified   Indications:  Pain Location:  Hip Site:  R greater trochanter Prep: patient was prepped and draped in usual sterile fashion   Needle Size:  22 G Approach:  Lateral Ultrasound Guidance: No   Fluoroscopic Guidance: No   Arthrogram: No   Medications:  3 mL lidocaine 1 %; 3 mL bupivacaine 0.5 %; 40 mg methylPREDNISolone acetate 40 MG/ML     Clinical Data: No additional findings.   Subjective: Chief Complaint  Patient presents with  . Right Hip - Pain    Tami Hernandez comes back today for recurrent right hip pain. A previous trochanteric injection helped significantly about 5 months ago. She is requesting another one. Denies any injuries or numbness. She still doing home exercises.    Review of Systems   Objective: Vital Signs: There were no vitals taken for this visit.  Physical Exam  Ortho Exam Right hip exam is stable. Tender to palpation over the lateral trochanter Specialty Comments:  No specialty comments available.  Imaging: No results found.   PMFS  History: Patient Active Problem List   Diagnosis Date Noted  . S/P primary low transverse C-section 11/17/2012   Past Medical History:  Diagnosis Date  . Medical history non-contributory     No family history on file.  Past Surgical History:  Procedure Laterality Date  . CESAREAN SECTION N/A 11/15/2012   Procedure: CESAREAN SECTION, twins ;  Surgeon: Oliver Pila, MD;  Location: [facility];  Service: Obstetrics;  Laterality: N/A;  primary  . egg harvesting     Social History   Occupational History  . Not on file.   Social History Main Topics  . Smoking status: Never Smoker  . Smokeless tobacco: Never Used  . Alcohol use No  . Drug use: No  . Sexual activity: Not on file

## 2017-05-14 ENCOUNTER — Ambulatory Visit (INDEPENDENT_AMBULATORY_CARE_PROVIDER_SITE_OTHER): Payer: BC Managed Care – PPO | Admitting: Orthopaedic Surgery

## 2017-05-14 ENCOUNTER — Encounter (INDEPENDENT_AMBULATORY_CARE_PROVIDER_SITE_OTHER): Payer: Self-pay | Admitting: Orthopaedic Surgery

## 2017-05-14 DIAGNOSIS — M25551 Pain in right hip: Secondary | ICD-10-CM | POA: Diagnosis not present

## 2017-05-14 MED ORDER — PREDNISONE 10 MG (21) PO TBPK: ORAL_TABLET | ORAL | 0 refills | Status: DC

## 2017-05-14 NOTE — Progress Notes (Signed)
   Office Visit Note   Patient: Tami Hernandez           Date of Birth: Oct 16, 1980           MRN: [id number] Visit Date: 05/14/2017              Requested by: Sigmund Hazel, MD [address] [place], [place] [address] PCP: Sigmund Hazel, MD   Assessment & Plan: Visit Diagnoses:  1. Pain in right hip     Plan: Recommend MRI of the right hip to rule out structural abnormality given no response to this most recent injection. Follow-up after the MRI. Prescription for prednisone.  Follow-Up Instructions: Return in about 10 days (around 05/24/2017).   Orders:  Orders Placed This Encounter  Procedures  . MR Hip Right w/o contrast   Meds ordered this encounter  Medications  . predniSONE (STERAPRED UNI-PAK 21 TAB) 10 MG (21) TBPK tablet    Sig: Take as directed    Dispense:  21 tablet    Refill:  0      Procedures: No procedures performed   Clinical Data: No additional findings.   Subjective: Chief Complaint  Patient presents with  . Right Hip - Pain    Patient follows up today for her right hip pain. Previous injection did not really give her much pain relief. She continues to have pain that radiates from the lateral aspect of the hip down to the lateral aspect of the knee. Driving makes it worse. Denies any numbness and tingling or back pain.    Review of Systems  Constitutional: Negative.   HENT: Negative.   Eyes: Negative.   Respiratory: Negative.   Cardiovascular: Negative.   Endocrine: Negative.   Musculoskeletal: Negative.   Neurological: Negative.   Hematological: Negative.   Psychiatric/Behavioral: Negative.   All other systems reviewed and are negative.    Objective: Vital Signs: There were no vitals taken for this visit.  Physical Exam  Constitutional: She is oriented to person, place, and time. She appears well-developed and well-nourished.  Pulmonary/Chest: Effort normal.  Neurological: She is alert and oriented to person, place, and time.    Skin: Skin is warm. Capillary refill takes less than 2 seconds.  Psychiatric: She has a normal mood and affect. Her behavior is normal. Judgment and thought content normal.  Nursing note and vitals reviewed.   Ortho Exam Right hip exam shows tenderness along the lateral aspect of the hip. No groin pain. Normal range of motion of the hip. Specialty Comments:  No specialty comments available.  Imaging: No results found.   PMFS History: Patient Active Problem List   Diagnosis Date Noted  . S/P primary low transverse C-section 11/17/2012   Past Medical History:  Diagnosis Date  . Medical history non-contributory     No family history on file.  Past Surgical History:  Procedure Laterality Date  . CESAREAN SECTION N/A 11/15/2012   Procedure: CESAREAN SECTION, twins ;  Surgeon: Oliver Pila, MD;  Location: [facility];  Service: Obstetrics;  Laterality: N/A;  primary  . egg harvesting     Social History   Occupational History  . Not on file.   Social History Main Topics  . Smoking status: Never Smoker  . Smokeless tobacco: Never Used  . Alcohol use No  . Drug use: No  . Sexual activity: Not on file

## 2017-06-04 ENCOUNTER — Ambulatory Visit
Admission: RE | Admit: 2017-06-04 | Discharge: 2017-06-04 | Disposition: A | Discharge status: Home / Self Care | Payer: No Typology Code available for payment source | Source: Ambulatory Visit | Attending: Orthopaedic Surgery | Admitting: Orthopaedic Surgery

## 2017-06-04 DIAGNOSIS — M25551 Pain in right hip: Secondary | ICD-10-CM

## 2017-06-07 ENCOUNTER — Ambulatory Visit (INDEPENDENT_AMBULATORY_CARE_PROVIDER_SITE_OTHER): Payer: BC Managed Care – PPO | Admitting: Orthopaedic Surgery

## 2017-06-07 ENCOUNTER — Encounter (INDEPENDENT_AMBULATORY_CARE_PROVIDER_SITE_OTHER): Payer: Self-pay | Admitting: Orthopaedic Surgery

## 2017-06-07 DIAGNOSIS — M25551 Pain in right hip: Secondary | ICD-10-CM

## 2017-06-07 NOTE — Progress Notes (Signed)
   Office Visit Note   Patient: Tami Hernandez           Date of Birth: Sep 30, 1980           MRN: [id number] Visit Date: 06/07/2017              Requested by: Sigmund HazelMiller, Lisa, MD [address] [place], [place] [address] PCP: Sigmund HazelMiller, Lisa, MD   Assessment & Plan: Visit Diagnoses:  1. 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  These findings were discussed with the patient.  I would like to try a diagnostic injection of her right hip joint with Dr. Alvester MorinNewton.  Follow-up in about 3 weeks for recheck.  If not better will need to consider lumbar spine MRI versus MR arthrogram.  Follow-Up Instructions: Return in about 3 weeks (around 06/28/2017).   Orders:  No orders of the defined types were placed in this encounter.  No orders of the defined types were placed in this encounter.     Procedures: No procedures performed   Clinical Data: No additional findings.   Subjective: Chief Complaint  Patient presents with  . Right Hip - Pain    Patient comes back today to review her MRI of the right hip.  She continues to have lateral hip pain that radiates down to the lateral aspect of the knee.  She denies any groin pain.     Review of Systems   Objective: Vital Signs: There were no vitals taken for this visit.  Physical Exam  Ortho Exam Right hip exam is stable Specialty Comments:  No specialty comments available.  Imaging: No results found.   PMFS History: Patient Active Problem List   Diagnosis Date Noted  . S/P primary low transverse C-section 11/17/2012   Past Medical History:  Diagnosis Date  . Medical history non-contributory     No family history on file.  Past Surgical History:  Procedure Laterality Date  . CESAREAN SECTION N/A 11/15/2012   Procedure: CESAREAN SECTION, twins ;  Surgeon: Oliver PilaKathy W Richardson, MD;  Location: [facility];  Service:  Obstetrics;  Laterality: N/A;  primary  . egg harvesting     Social History   Occupational History  . Not on file.   Social History Main Topics  . Smoking status: Never Smoker  . Smokeless tobacco: Never Used  . Alcohol use No  . Drug use: No  . Sexual activity: Not on file

## 2017-06-07 NOTE — Addendum Note (Signed)
Addended by: Albertina ParrGARCIA, Miata Culbreth on: 06/07/2017 09:09 AM   Modules accepted: Orders

## 2017-06-11 ENCOUNTER — Encounter (INDEPENDENT_AMBULATORY_CARE_PROVIDER_SITE_OTHER): Payer: Self-pay | Admitting: Physical Medicine and Rehabilitation

## 2017-06-11 ENCOUNTER — Ambulatory Visit (INDEPENDENT_AMBULATORY_CARE_PROVIDER_SITE_OTHER): Payer: BC Managed Care – PPO | Admitting: Physical Medicine and Rehabilitation

## 2017-06-11 ENCOUNTER — Ambulatory Visit (INDEPENDENT_AMBULATORY_CARE_PROVIDER_SITE_OTHER): Payer: BC Managed Care – PPO

## 2017-06-11 DIAGNOSIS — M25551 Pain in right hip: Secondary | ICD-10-CM

## 2017-06-11 MED ORDER — TRIAMCINOLONE ACETONIDE 40 MG/ML IJ SUSP
80.0000 mg | INTRAMUSCULAR | Status: AC | PRN
  Administered 2017-06-11: 80 mg via INTRA_ARTICULAR

## 2017-06-11 MED ORDER — BUPIVACAINE HCL 0.5 % IJ SOLN
3.0000 mL | INTRAMUSCULAR | Status: AC | PRN
  Administered 2017-06-11: 3 mL via INTRA_ARTICULAR

## 2017-06-11 NOTE — Progress Notes (Signed)
Tami Hernandez - 36 y.o. female MRN [id number]  Date of birth: September 12, 1980  Office Visit Note: Visit Date: 06/11/2017 PCP: Sigmund Hazel, MD Referred by: Sigmund Hazel, MD  Subjective: Chief Complaint  Patient presents with  . Right Hip - Pain   HPI: Tami Hernandez is a 36 year old female with chronic worsening history of right hip pain.  Her pain starts at the lateral hip and sometimes radiates to the thigh.  She does get some groin pain.  Her pain is worse with with activity and standing and driving.  She denies any left-sided symptoms.  She has been followed by Dr. Roda Shutters and has had a an MRI of the hip showing partial thickness cartilage loss of the superior anterior right acetabulum with subchondral reactive marrow changes.  We are going to complete a diagnostic and hopefully therapeutic anesthetic hip arthrogram.    ROS Otherwise per HPI.  Assessment & Plan: Visit Diagnoses:  1. Pain in right hip     Plan: Findings:  Diagnostic and therapeutic anesthetic hip arthrogram.  Patient did have some relief during the anesthetic phase.    Meds & Orders: No orders of the defined types were placed in this encounter.   Orders Placed This Encounter  Procedures  . Large Joint Injection/Arthrocentesis  . XR C-ARM NO REPORT    Follow-up: Return if symptoms worsen or fail to improve, for Dr. Roda Shutters.   Procedures: Large Joint Inj Date/Time: 06/11/2017 8:25 AM Performed by: Tyrell Antonio Authorized by: Tyrell Antonio   Consent Given by:  Patient Site marked: the procedure site was marked   Timeout: prior to procedure the correct patient, procedure, and site was verified   Indications:  Pain and diagnostic evaluation Location:  Hip Site:  R hip joint Prep: patient was prepped and draped in usual sterile fashion   Needle Size:  22 G Approach:  Anterior Ultrasound Guidance: No   Fluoroscopic Guidance: No   Arthrogram: Yes   Medications:  80 mg triamcinolone acetonide 40 MG/ML; 3 mL  bupivacaine 0.5 % Aspiration Attempted: Yes   Patient tolerance:  Patient tolerated the procedure well with no immediate complications  Arthrogram demonstrated excellent flow of contrast throughout the joint surface without extravasation or obvious defect.  The patient had relief of symptoms during the anesthetic phase of the injection.     No notes on file   Clinical History: MR OF THE RIGHT HIP WITHOUT CONTRAST  TECHNIQUE: Multiplanar, multisequence MR imaging was performed. No intravenous contrast was administered.  COMPARISON:  None.  FINDINGS: Bones: No hip fracture, dislocation or avascular necrosis. No periosteal reaction or bone destruction. No aggressive osseous lesion.  Normal sacrum and sacroiliac joints. No SI joint widening or erosive changes.  Articular cartilage and labrum  Articular cartilage: Partial-thickness cartilage loss of the superior anterior right acetabulum with sub chondral reactive marrow changes.  Labrum: Grossly intact, but evaluation is limited by lack of intraarticular fluid.  Joint or bursal effusion  Joint effusion:  No hip joint effusion.  No SI joint effusion.  Bursae:  No bursa formation.  Muscles and tendons  Flexors: Normal.  Extensors: Normal.  Abductors: Normal.  Adductors: Normal.  Gluteals: Normal.  Hamstrings: Normal.  Other findings  Miscellaneous: No pelvic free fluid. No fluid collection or hematoma. No inguinal lymphadenopathy. No inguinal hernia.  IMPRESSION: 1. Partial-thickness cartilage loss of the superior anterior right acetabulum with subchondral reactive marrow changes. The subchondral reactive changes bar somewhat disproportionate to the degree of cartilage loss. Follow-up  MR arthrogram of the right hip is recommended to better delineate the cartilage and any labral pathology.   Electronically Signed   By: Elige KoHetal  Patel   On: 06/04/2017 12:54  She reports that she has  never smoked. She has never used smokeless tobacco. No results for input(s): HGBA1C, LABURIC in the last 8760 hours.  Objective:  VS:  HT:    WT:   BMI:     BP:   HR: bpm  TEMP: ( )  RESP:  Physical Exam  Musculoskeletal:  Patient ambulates without aid with a normal gait.  She has good distal strength.  She has good range of motion of the hips.    Ortho Exam Imaging: Xr C-arm No Report  Result Date: 06/11/2017 Please see Notes or Procedures tab for imaging impression.   Past Medical/Family/Surgical/Social History: Medications & Allergies reviewed per EMR Patient Active Problem List   Diagnosis Date Noted  . S/P primary low transverse C-section 11/17/2012   Past Medical History:  Diagnosis Date  . Medical history non-contributory    History reviewed. No pertinent family history. Past Surgical History:  Procedure Laterality Date  . CESAREAN SECTION N/A 11/15/2012   Procedure: CESAREAN SECTION, twins ;  Surgeon: Oliver PilaKathy W Richardson, MD;  Location: [facility];  Service: Obstetrics;  Laterality: N/A;  primary  . egg harvesting     Social History   Occupational History  . Not on file.   Social History Main Topics  . Smoking status: Never Smoker  . Smokeless tobacco: Never Used  . Alcohol use No  . Drug use: No  . Sexual activity: Not on file

## 2017-06-11 NOTE — Progress Notes (Deleted)
Right hip pain. Hurts with standing, driving, increases with more activity. Comes and goes. Pain starts at lateral hip and sometimes radiates down thigh to knee. Does has a little bit of groin pain.

## 2017-06-11 NOTE — Patient Instructions (Signed)

## 2017-06-28 ENCOUNTER — Ambulatory Visit (INDEPENDENT_AMBULATORY_CARE_PROVIDER_SITE_OTHER): Payer: BC Managed Care – PPO | Admitting: Orthopaedic Surgery

## 2017-06-28 ENCOUNTER — Encounter (INDEPENDENT_AMBULATORY_CARE_PROVIDER_SITE_OTHER): Payer: Self-pay | Admitting: Orthopaedic Surgery

## 2017-06-28 DIAGNOSIS — M25551 Pain in right hip: Secondary | ICD-10-CM | POA: Diagnosis not present

## 2017-06-28 NOTE — Progress Notes (Signed)
   Office Visit Note   Patient: Tami PeaSarah A Hacking           Date of Birth: 08/02/1981           MRN: [id number] Visit Date: 06/28/2017              Requested by: Sigmund HazelMiller, Lisa, MD [address] [place], [place] [address] PCP: Sigmund HazelMiller, Lisa, MD   Assessment & Plan: Visit Diagnoses:  1. Pain in right hip     Plan: Patient has responded very well from the hip injection.  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  Patient is doing well from my standpoint.  Activity as tolerated.  Follow-up as needed.  Follow-Up Instructions: Return if symptoms worsen or fail to improve.   Orders:  No orders of the defined types were placed in this encounter.  No orders of the defined types were placed in this encounter.     Procedures: No procedures performed   Clinical Data: No additional findings.   Subjective: Chief Complaint  Patient presents with  . Right Hip - Pain    Helem follows up today for her right hip pain status post injection.  She feels much better.  She has minimal pain.    Review of Systems   Objective: Vital Signs: There were no vitals taken for this visit.  Physical Exam  Ortho Exam Right hip exam is benign. Specialty Comments:  No specialty comments available.  Imaging: No results found.   PMFS History: Patient Active Problem List   Diagnosis Date Noted  . S/P primary low transverse C-section 11/17/2012   Past Medical History:  Diagnosis Date  . Medical history non-contributory     History reviewed. No pertinent family history.  Past Surgical History:  Procedure Laterality Date  . egg harvesting     Social History   Occupational History  . Not on file  Tobacco Use  . Smoking status: Never Smoker  . Smokeless tobacco: Never Used  Substance and Sexual Activity  . Alcohol use: No  . Drug use: No  . Sexual activity: Not on file

## 2018-08-15 ENCOUNTER — Telehealth (INDEPENDENT_AMBULATORY_CARE_PROVIDER_SITE_OTHER): Payer: Self-pay | Admitting: Physical Medicine and Rehabilitation

## 2018-08-15 NOTE — Telephone Encounter (Signed)
Should be ok but need to/please check with Dr. Roda ShuttersXu, he saw her in November and she was doing well.

## 2018-08-15 NOTE — Telephone Encounter (Signed)
See message below °

## 2018-08-15 NOTE — Telephone Encounter (Signed)
Please advise. Ok to repeat right hip injection?

## 2018-08-15 NOTE — Telephone Encounter (Signed)
Yes. It's been enough time.  Can schedule with hilts or newton, whoever can get her in sooner

## 2018-08-16 NOTE — Telephone Encounter (Signed)
Do you have any availability for a hip injection? Dr. Horse Shoe BlasNewton's next opening is 1/13.

## 2018-08-16 NOTE — Telephone Encounter (Signed)
Please schedule patient an appointment for Injection.  See message below, per Dr. Roda ShuttersXu.

## 2018-08-18 NOTE — Telephone Encounter (Signed)
Patient is coming in tomorrow to see Dr. Prince Rome for this injection.

## 2018-08-19 ENCOUNTER — Ambulatory Visit (INDEPENDENT_AMBULATORY_CARE_PROVIDER_SITE_OTHER): Payer: BC Managed Care – PPO | Admitting: Family Medicine

## 2018-08-19 ENCOUNTER — Encounter (INDEPENDENT_AMBULATORY_CARE_PROVIDER_SITE_OTHER): Payer: Self-pay | Admitting: Family Medicine

## 2018-08-19 DIAGNOSIS — M25551 Pain in right hip: Secondary | ICD-10-CM

## 2018-08-19 MED ORDER — METHYLPREDNISOLONE ACETATE 40 MG/ML IJ SUSP
40.0000 mg | INTRAMUSCULAR | Status: AC | PRN
  Administered 2018-08-19: 40 mg via INTRA_ARTICULAR

## 2018-08-19 MED ORDER — LIDOCAINE HCL (PF) 1 % IJ SOLN
5.0000 mL | INTRAMUSCULAR | Status: AC | PRN
  Administered 2018-08-19: 5 mL

## 2018-08-19 NOTE — Progress Notes (Signed)
  Tami Hernandez - 38 y.o. female MRN [id number]  Date of birth: 12/23/1980    SUBJECTIVE:      Chief Complaint:/ HPI:  38 y/o female with right hip pain. Pt has chondromalacia of the superior anterior acetabulum.  On 06/11/2017 she had a steroid injection performed to which she responded very well.  She is done well for about the past year and has had continued benefit with the addition of weight loss and dietary changes.  She continues to remain active but does avoid running or higher impact activities.  She would like to repeat steroid injection today.   ROS:     See HPI  PERTINENT  PMH / PSH FH / / SH:  Past Medical, Surgical, Social, and Family History Reviewed & Updated in the EMR.   OBJECTIVE: There were no vitals taken for this visit.  Physical Exam:  Vital signs are reviewed.  GEN: Alert and oriented, NAD Pulm: Breathing unlabored PSY: normal mood, congruent affect  MSK: Right hip:  - Inspection: No gross deformity, no swelling, erythema, or ecchymosis - Palpation: No TTP - ROM: Normal range of motion   Procedure performed: hip intraarticular corticosteroid injection; ultrasound guided, in-plane approach  Consent obtained and verified. Time-out conducted. Noted no overlying erythema, induration, or other signs of local infection. The RIGHT Hip Joint was identified with ultrasound using. The overlying skin was prepped in a sterile fashion. Topical analgesic spray: Ethyl chloride. Joint: RIGHT hip joint Needle: 22ga, 3" Completed without difficulty. Meds: depomedrol 40mg , lidocaine 8cc       ASSESSMENT & PLAN:  1. Right hip pain - pt has recurrent pain due to underlying chondromalacia. She has done well over the past year after her last steroid injection. This was repeated today. Procedure tolerated well. We also discussed alternate/additional treatment options with Platelet Rich Plasma as well as off-label use of viscosupplementation.

## 2018-08-19 NOTE — Progress Notes (Signed)
   Office Visit Note   Patient: Tami Hernandez           Date of Birth: [birth year]           MRN: [id number] Visit Date: 08/19/2018 Requested by: Sigmund HazelMiller, Lisa, MD [address] [place], [place] [id number] PCP: Sigmund HazelMiller, Lisa, MD  Subjective: Chief Complaint  Patient presents with  . Right Hip - Pain    US - guided cortisone injection per Dr. Roda ShuttersXu    HPI: She is a 38 year old with recurrent right hip pain.  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  Injection in October 2018 helped quite a bit until just a few weeks ago.  Pain is on the lateral and anterior aspect of her hip.  She tries to minimize sugar, wheat and dairy.  These foods seem to exacerbate her symptoms.  She stays active exercising at the gym doing mostly low impact exercises.              ROS: Otherwise noncontributory  Objective: Vital Signs: There were no vitals taken for this visit.  Physical Exam:  Right hip:  Good ROM, mild pain at GT.    Imaging: None  Assessment & Plan: 1.  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  If only short-term relief, could consider PRP or Toradol injection in the future.   Follow-Up Instructions: Return if symptoms worsen or fail to improve.      Procedures: Large Joint Inj on 08/19/2018 9:21 AM Details: 22 G 1.5 in and 3.5 in needle, ultrasound-guided anterolateral approach  Arthrogram: No  Medications: 5 mL lidocaine (PF) 1 %; 40 mg methylPREDNISolone acetate 40 MG/ML Procedure, treatment alternatives, risks and benefits explained, specific risks discussed. Consent was given by the patient. Immediately prior to procedure a time out was called to verify the correct patient, procedure, equipment, support staff and site/side marked as required. Patient was prepped and draped in the usual sterile fashion.      No notes on file    PMFS History: Patient Active Problem List   Diagnosis Date Noted  . S/P primary low  transverse C-section 11/17/2012   Past Medical History:  Diagnosis Date  . Medical history non-contributory     History reviewed. No pertinent family history.  Past Surgical History:  Procedure Laterality Date  . CESAREAN SECTION N/A 11/15/2012   Procedure: CESAREAN SECTION, twins ;  Surgeon: Oliver PilaKathy W Richardson, MD;  Location: [facility];  Service: Obstetrics;  Laterality: N/A;  primary  . egg harvesting     Social History   Occupational History  . Not on file  Tobacco Use  . Smoking status: Never Smoker  . Smokeless tobacco: Never Used  Substance and Sexual Activity  . Alcohol use: No  . Drug use: No  . Sexual activity: Not on file

## 2019-05-11 IMAGING — MR MR HIP*R* W/O CM
4 of 5 series · 16 of 40 positions shown · non-contrast
Comparison: None.

CLINICAL DATA: 10-11 months of right hip pain that is mostly
lateral and radiates across into her groin and into her right knee
at times. She is a runner and does a lot of physical activity. No
specific injury.

EXAM:
MR OF THE RIGHT HIP WITHOUT CONTRAST
TECHNIQUE: Multiplanar, multisequence MR imaging was performed. No intravenous
contrast was administered.

[Series 4: T1 · coronal · 4.0mm · 0.59mm/px · 3 of 19 slices shown (1 of 2)]
[im 4/19]
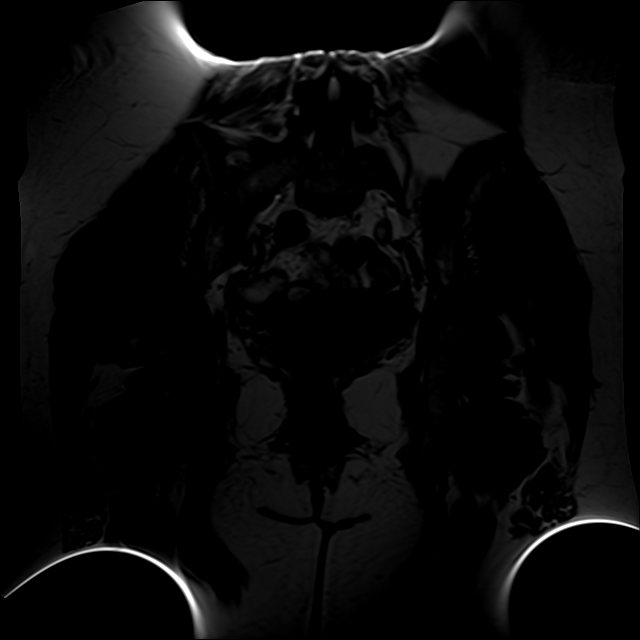
[im 11/19]
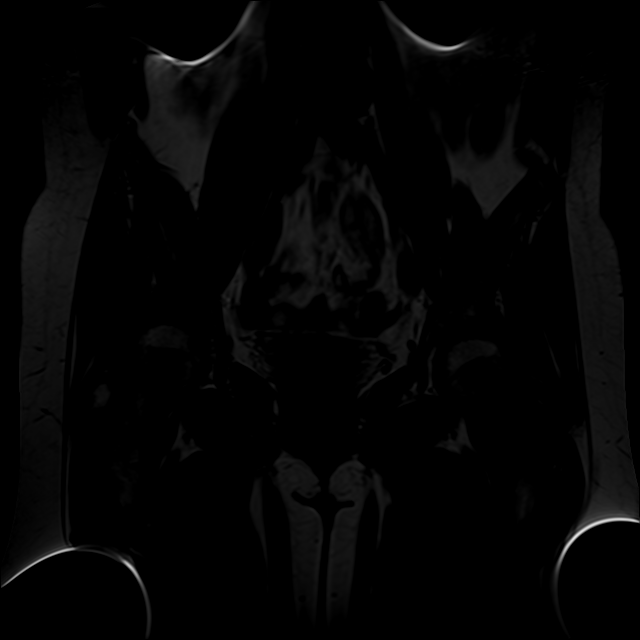
[im 19/19]
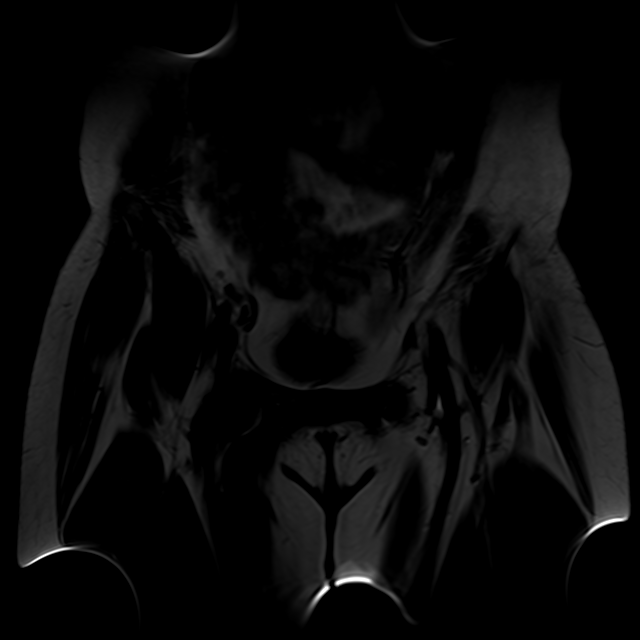

[Series 5: T2 fat-sat · axial · 4.0mm · 0.59mm/px · z∈[-67,+34]mm · 3 of 29 slices shown]
[im 4/29]
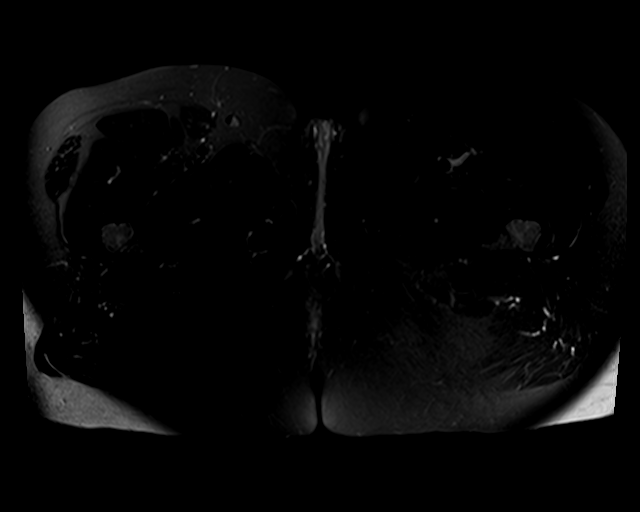
[im 16/29]
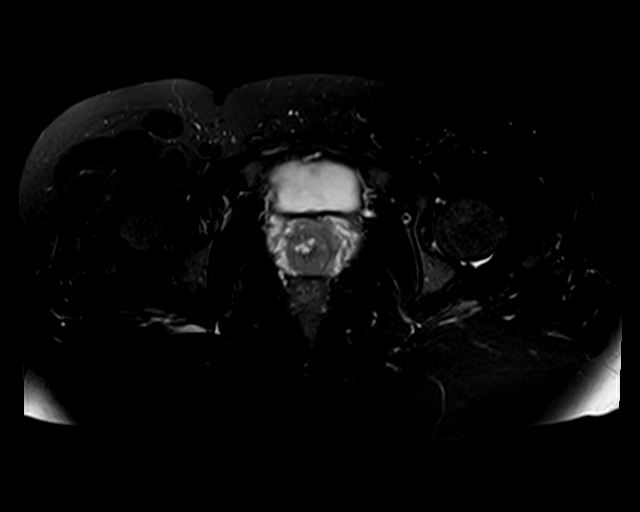
[im 25/29]
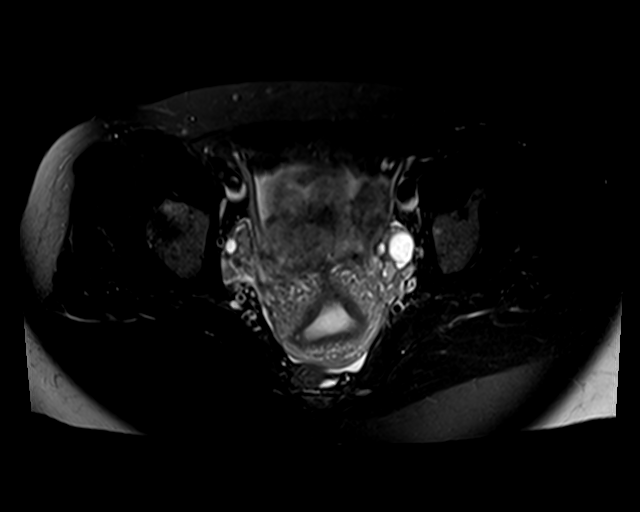

[Series 6: T1 · axial · 4.0mm · 0.59mm/px · z∈[-67,+34]mm · 3 of 29 slices shown (2 of 2)]
[im 4/29]
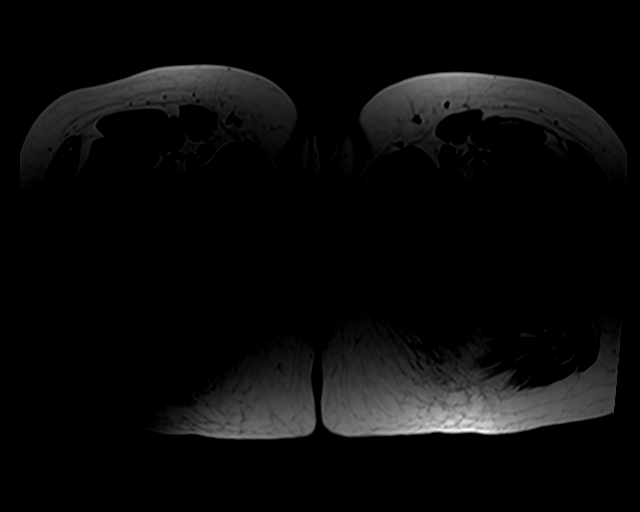
[im 16/29]
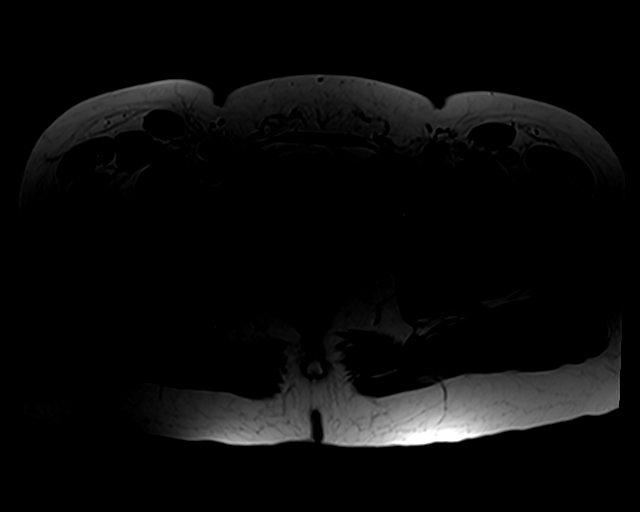
[im 25/29]
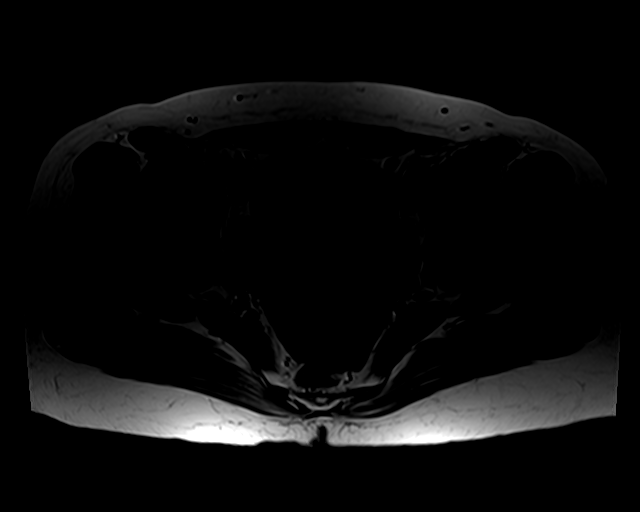

[Series 7: PD fat-sat · sagittal · 4.0mm · 0.33mm/px · 7 of 21 slices shown]
[im 1/21]
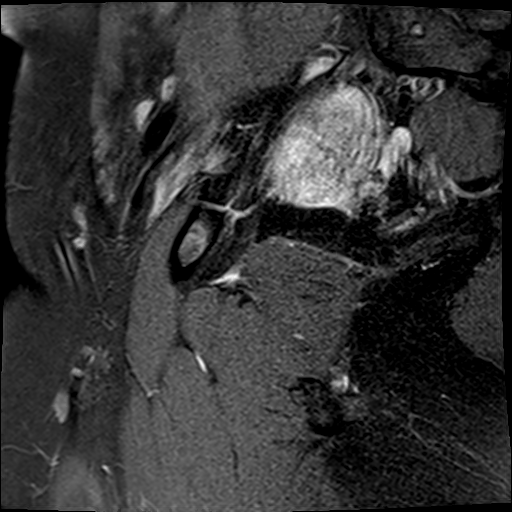
[im 4/21]
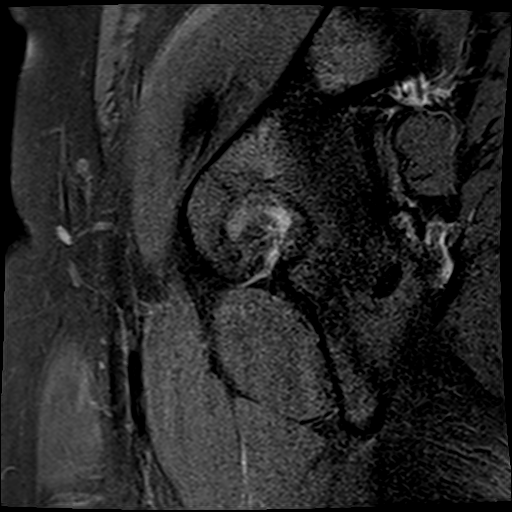
[im 7/21]
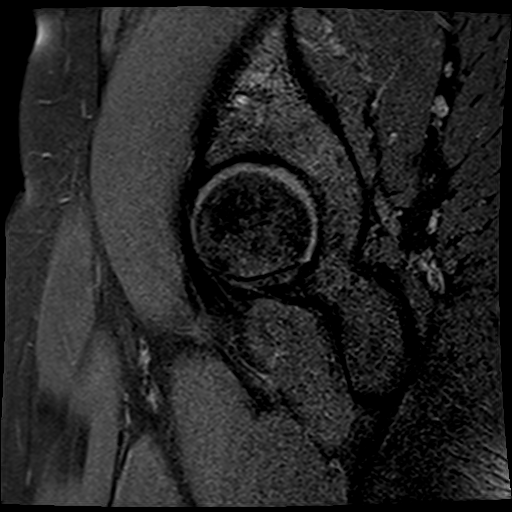
[im 11/21]
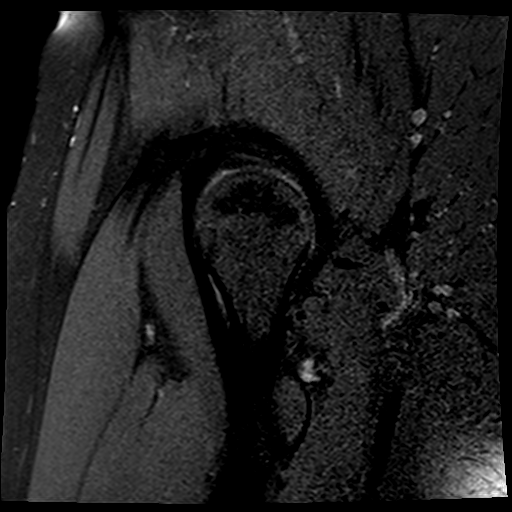
[im 14/21]
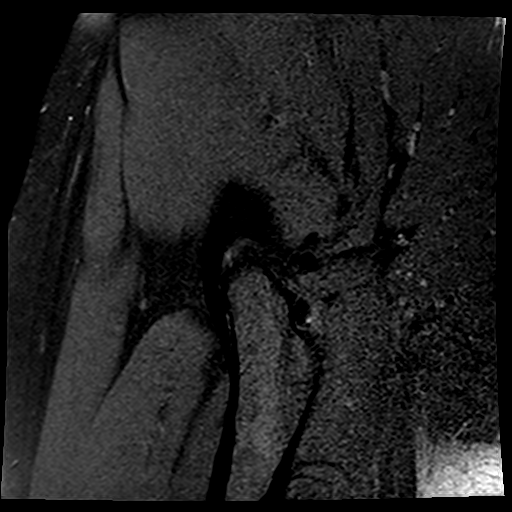
[im 17/21]
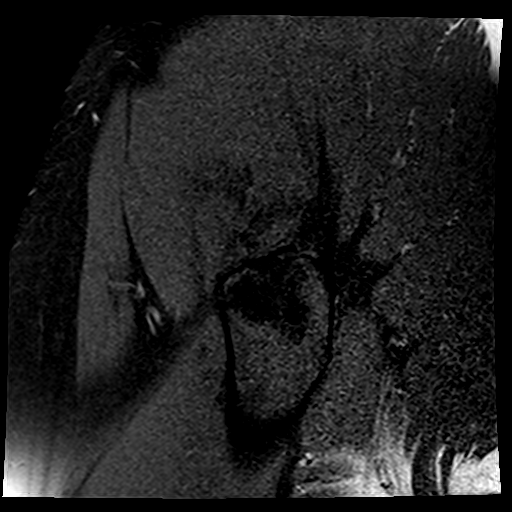
[im 21/21]
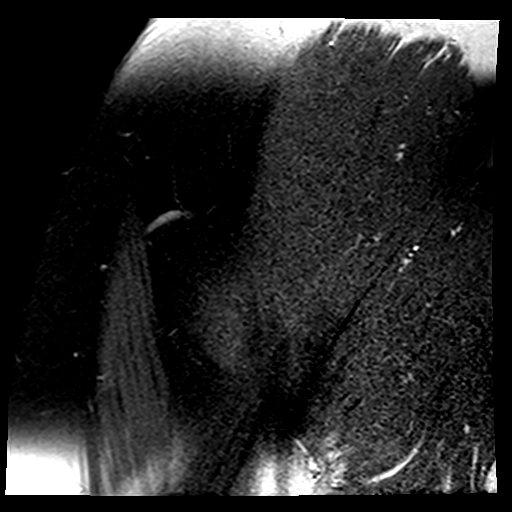

[16 of 40 positions shown; findings below may reference images not displayed]

FINDINGS: Bones: No hip fracture, dislocation or avascular necrosis. No
periosteal reaction or bone destruction. No aggressive osseous
lesion.

Normal sacrum and sacroiliac joints. No SI joint widening or erosive
changes.

Articular cartilage and labrum

Articular cartilage: Partial-thickness cartilage loss of the
superior anterior right acetabulum with sub chondral reactive marrow
changes.

Labrum: Grossly intact, but evaluation is limited by lack of
intraarticular fluid.

Joint or bursal effusion

Joint effusion:  No hip joint effusion.  No SI joint effusion.

Bursae:  No bursa formation.

Muscles and tendons

Flexors: Normal.

Extensors: Normal.

Abductors: Normal.

Adductors: Normal.

Gluteals: Normal.

Hamstrings: Normal.

Other findings

Miscellaneous: No pelvic free fluid. No fluid collection or
hematoma. No inguinal lymphadenopathy. No inguinal hernia.
IMPRESSION: 1. Partial-thickness cartilage loss of the superior anterior right
acetabulum with subchondral reactive marrow changes. The subchondral
reactive changes bar somewhat disproportionate to the degree of
cartilage loss. Follow-up MR arthrogram of the right hip is
recommended to better delineate the cartilage and any labral
pathology.

## 2020-05-08 ENCOUNTER — Ambulatory Visit: Payer: Self-pay

## 2020-05-08 ENCOUNTER — Other Ambulatory Visit: Payer: Self-pay

## 2020-05-08 ENCOUNTER — Ambulatory Visit: Payer: No Typology Code available for payment source | Admitting: Family Medicine

## 2020-05-08 ENCOUNTER — Encounter: Payer: Self-pay | Admitting: Family Medicine

## 2020-05-08 DIAGNOSIS — M25551 Pain in right hip: Secondary | ICD-10-CM

## 2020-05-08 NOTE — Progress Notes (Signed)
   Office Visit Note   Patient: Tami Hernandez           Date of Birth: August 09, 1981           MRN: [id number] Visit Date: 05/08/2020 Requested by: Sigmund Hazel, MD [address] [place],  [place] [address] PCP: Sigmund Hazel, MD  Subjective: Chief Complaint  Patient presents with  . Right Hip - Pain    HPI: He is here with recurrent right hip pain.  Injection helped for about a year and a half.  Recently started having pain on the anterolateral aspect of her hip.              ROS:   All other systems were reviewed and are negative.  Objective: Vital Signs: There were no vitals taken for this visit.  Physical Exam:  General:  Alert and oriented, in no acute distress. Pulm:  Breathing unlabored. Psy:  Normal mood, congruent affect.  Right hip: She has some tenderness over the greater trochanter but also pain with passive internal rotation.  Still has pretty good range of motion.  Imaging: US Guided Needle Placement - No Linked Charges  Result Date: 05/08/2020 Ultrasound-guided right hip injection: After sterile prep with Betadine, injected 8 cc 1% lidocaine without epinephrine and 40 mg methylprednisolone using a 22-gauge spinal needle, passing the needle through the iliofemoral ligament into the femoral head/neck junction.  Injectate seen filling joint capsule.     Assessment & Plan: 1.  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  If she does not get long-lasting relief, could contemplate dextrose prolotherapy or PRP.     Procedures: No procedures performed  No notes on file     PMFS History: Patient Active Problem List   Diagnosis Date Noted  . S/P primary low transverse C-section 11/17/2012   Past Medical History:  Diagnosis Date  . Medical history non-contributory     History reviewed. No pertinent family history.  Past Surgical History:  Procedure Laterality Date  . CESAREAN SECTION N/A 11/15/2012    Procedure: CESAREAN SECTION, twins ;  Surgeon: Oliver Pila, MD;  Location: [facility];  Service: Obstetrics;  Laterality: N/A;  primary  . egg harvesting     Social History   Occupational History  . Not on file  Tobacco Use  . Smoking status: Never Smoker  . Smokeless tobacco: Never Used  Substance and Sexual Activity  . Alcohol use: No  . Drug use: No  . Sexual activity: Not on file

## 2020-05-08 NOTE — Progress Notes (Signed)
Received over a year of relief from last injection would like another one   Would also like to discuss other treatment plans

## 2021-05-27 ENCOUNTER — Other Ambulatory Visit: Payer: Self-pay

## 2021-05-27 ENCOUNTER — Other Ambulatory Visit: Payer: Self-pay | Admitting: Family Medicine

## 2021-05-27 ENCOUNTER — Ambulatory Visit
Admission: RE | Admit: 2021-05-27 | Discharge: 2021-05-27 | Disposition: A | Discharge status: Home / Self Care | Payer: BC Managed Care – PPO | Source: Ambulatory Visit | Attending: Family Medicine | Admitting: Family Medicine

## 2021-05-27 DIAGNOSIS — M542 Cervicalgia: Secondary | ICD-10-CM

## 2022-05-20 ENCOUNTER — Ambulatory Visit: Payer: BC Managed Care – PPO | Admitting: Orthopaedic Surgery

## 2022-05-20 ENCOUNTER — Ambulatory Visit (INDEPENDENT_AMBULATORY_CARE_PROVIDER_SITE_OTHER): Payer: BC Managed Care – PPO

## 2022-05-20 ENCOUNTER — Ambulatory Visit: Payer: Self-pay

## 2022-05-20 ENCOUNTER — Encounter: Payer: Self-pay | Admitting: Orthopaedic Surgery

## 2022-05-20 VITALS — BP 129/84 | HR 91

## 2022-05-20 DIAGNOSIS — M1611 Unilateral primary osteoarthritis, right hip: Secondary | ICD-10-CM | POA: Diagnosis not present

## 2022-05-20 DIAGNOSIS — M25551 Pain in right hip: Secondary | ICD-10-CM | POA: Diagnosis not present

## 2022-05-20 MED ORDER — METHYLPREDNISOLONE ACETATE 40 MG/ML IJ SUSP
80.0000 mg | INTRAMUSCULAR | Status: AC | PRN
  Administered 2022-05-20: 80 mg via INTRA_ARTICULAR

## 2022-05-20 MED ORDER — LIDOCAINE HCL 1 % IJ SOLN
4.0000 mL | INTRAMUSCULAR | Status: AC | PRN
  Administered 2022-05-20: 4 mL

## 2022-05-20 NOTE — Progress Notes (Signed)
   Procedure Note  Patient: Tami Hernandez             Date of Birth: 18-Oct-1980           MRN: [id number]             Visit Date: 05/20/2022  Procedures: Visit Diagnoses:  1. Pain in right hip   2. Primary osteoarthritis of right hip     Large Joint Inj: R hip joint on 05/20/2022 8:35 AM Indications: pain Details: 22 G 3.5 in needle, ultrasound-guided anterior approach Medications: 4 mL lidocaine 1 %; 80 mg methylPREDNISolone acetate 40 MG/ML Outcome: tolerated well, no immediate complications  Procedure: US-guided intra-articular hip injection, right After discussion on risks/benefits/indications and informed verbal consent was obtained, a timeout was performed. Patient was lying supine on exam table. The hip was cleaned with betadine and alcohol swabs. Then utilizing ultrasound guidance, the patient's femoral head and neck junction was identified and subsequently injected with 4:2 lidocaine:depomedrol via an in-plane approach with ultrasound visualization of the injectate administered into the hip joint. Patient tolerated procedure well without immediate complications.  Procedure, treatment alternatives, risks and benefits explained, specific risks discussed. Consent was given by the patient. Immediately prior to procedure a time out was called to verify the correct patient, procedure, equipment, support staff and site/side marked as required. Patient was prepped and draped in the usual sterile fashion.    - I evaluated the patient about 10 minutes post-injection and they had improvement in pain and range of motion - follow-up with Dr. Erlinda Hong as indicated; I am happy to see them as needed  Elba Barman, DO [facility]  This note was dictated using Dragon naturally speaking software and may contain errors in syntax, spelling, or content which have not been identified prior to signing this note.

## 2022-05-20 NOTE — Progress Notes (Signed)
   Office Visit Note   Patient: Tami Hernandez           Date of Birth: [birth year]           MRN: [id number] Visit Date: 05/20/2022              Requested by: Kathyrn Lass, [place],  [place] [address] PCP: Kathyrn Lass, MD   Assessment & Plan: Visit Diagnoses:  1. Primary osteoarthritis of right hip     Plan: The x-rays do show some progression of degenerative joint disease special on the acetabular side with subchondral sclerosis and formation of 2 cysts.  The joint space is still well preserved.  No arthritic changes seen on the femoral side.  We will send her upstairs for another intra-articular injection with Dr. Rolena Infante today.  We will see her back as needed.  Follow-Up Instructions: No follow-ups on file.   Orders:  Orders Placed This Encounter  Procedures   XR HIP UNILAT W OR W/O PELVIS 2-3 VIEWS RIGHT   No orders of the defined types were placed in this encounter.     Procedures: No procedures performed   Clinical Data: No additional findings.   Subjective: Chief Complaint  Patient presents with   Right Hip - Pain    HPI Tami Hernandez returns today for recurrent right hip pain.  She has known arthritis and chondromalacia worse on the acetabular side.  Previous injection was about 2 years ago from which she received excellent relief.  She has noticed recurrence in her symptoms and requesting another injection today.  Review of Systems   Objective: Vital Signs: There were no vitals taken for this visit.  Physical Exam  Ortho Exam Examination of right hip is unchanged. Specialty Comments:  No specialty comments available.  Imaging: XR HIP UNILAT W OR W/O PELVIS 2-3 VIEWS RIGHT  Result Date: 05/20/2022 There has been interval development of subchondral sclerosis and formation of 2 cysts in the acetabulum.  Joint spaces well-maintained.    PMFS History: Patient Active Problem List   Diagnosis Date Noted   Primary osteoarthritis of right  hip 05/20/2022   S/P primary low transverse C-section 11/17/2012   Past Medical History:  Diagnosis Date   Medical history non-contributory     No family history on file.  Past Surgical History:  Procedure Laterality Date   CESAREAN SECTION N/A 11/15/2012   Procedure: CESAREAN SECTION, twins ;  Surgeon: Logan Bores, MD;  Location: [facility];  Service: Obstetrics;  Laterality: N/A;  primary   egg harvesting     Social History   Occupational History   Not on file  Tobacco Use   Smoking status: Never   Smokeless tobacco: Never  Substance and Sexual Activity   Alcohol use: No   Drug use: No   Sexual activity: Not on file

## 2022-07-17 ENCOUNTER — Other Ambulatory Visit (HOSPITAL_COMMUNITY): Payer: Self-pay

## 2022-07-17 ENCOUNTER — Other Ambulatory Visit (HOSPITAL_BASED_OUTPATIENT_CLINIC_OR_DEPARTMENT_OTHER): Payer: Self-pay

## 2022-07-17 MED ORDER — LISDEXAMFETAMINE DIMESYLATE 40 MG PO CAPS
ORAL_CAPSULE | ORAL | 0 refills | Status: DC
  Filled 2022-07-17: qty 30, 30d supply, fill #0

## 2022-07-17 MED ORDER — LISDEXAMFETAMINE DIMESYLATE 40 MG PO CAPS
40.0000 mg | ORAL_CAPSULE | Freq: Every day | ORAL | 0 refills | Status: DC
  Filled 2022-07-17: qty 30, 30d supply, fill #0

## 2022-08-13 ENCOUNTER — Other Ambulatory Visit (HOSPITAL_BASED_OUTPATIENT_CLINIC_OR_DEPARTMENT_OTHER): Payer: Self-pay

## 2022-08-13 MED ORDER — LISDEXAMFETAMINE DIMESYLATE 40 MG PO CAPS
40.0000 mg | ORAL_CAPSULE | Freq: Every day | ORAL | 0 refills | Status: DC
  Filled 2022-08-14: qty 30, 30d supply, fill #0

## 2022-08-14 ENCOUNTER — Other Ambulatory Visit (HOSPITAL_BASED_OUTPATIENT_CLINIC_OR_DEPARTMENT_OTHER): Payer: Self-pay

## 2022-08-14 ENCOUNTER — Other Ambulatory Visit: Payer: Self-pay

## 2022-09-14 ENCOUNTER — Other Ambulatory Visit (HOSPITAL_BASED_OUTPATIENT_CLINIC_OR_DEPARTMENT_OTHER): Payer: Self-pay

## 2022-09-14 MED ORDER — LISDEXAMFETAMINE DIMESYLATE 40 MG PO CAPS
40.0000 mg | ORAL_CAPSULE | Freq: Every day | ORAL | 0 refills | Status: DC
  Filled 2022-09-14: qty 30, 30d supply, fill #0

## 2022-09-15 ENCOUNTER — Other Ambulatory Visit (HOSPITAL_BASED_OUTPATIENT_CLINIC_OR_DEPARTMENT_OTHER): Payer: Self-pay

## 2022-10-15 ENCOUNTER — Other Ambulatory Visit: Payer: Self-pay

## 2022-10-15 ENCOUNTER — Other Ambulatory Visit (HOSPITAL_BASED_OUTPATIENT_CLINIC_OR_DEPARTMENT_OTHER): Payer: Self-pay

## 2022-10-15 MED ORDER — LISDEXAMFETAMINE DIMESYLATE 40 MG PO CAPS
40.0000 mg | ORAL_CAPSULE | Freq: Every day | ORAL | 0 refills | Status: DC
  Filled 2022-10-15: qty 30, 30d supply, fill #0

## 2022-10-16 ENCOUNTER — Other Ambulatory Visit (HOSPITAL_BASED_OUTPATIENT_CLINIC_OR_DEPARTMENT_OTHER): Payer: Self-pay

## 2022-10-16 ENCOUNTER — Other Ambulatory Visit: Payer: Self-pay

## 2022-11-11 ENCOUNTER — Other Ambulatory Visit (HOSPITAL_BASED_OUTPATIENT_CLINIC_OR_DEPARTMENT_OTHER): Payer: Self-pay

## 2022-11-11 MED ORDER — LISDEXAMFETAMINE DIMESYLATE 40 MG PO CAPS
40.0000 mg | ORAL_CAPSULE | Freq: Every day | ORAL | 0 refills | Status: DC
  Filled 2022-11-14 – 2022-11-18 (×5): qty 30, 30d supply, fill #0

## 2022-11-12 ENCOUNTER — Other Ambulatory Visit (HOSPITAL_BASED_OUTPATIENT_CLINIC_OR_DEPARTMENT_OTHER): Payer: Self-pay

## 2022-11-14 ENCOUNTER — Other Ambulatory Visit (HOSPITAL_BASED_OUTPATIENT_CLINIC_OR_DEPARTMENT_OTHER): Payer: Self-pay

## 2022-11-14 ENCOUNTER — Encounter (HOSPITAL_BASED_OUTPATIENT_CLINIC_OR_DEPARTMENT_OTHER): Payer: Self-pay | Admitting: Pharmacist

## 2022-11-16 ENCOUNTER — Other Ambulatory Visit (HOSPITAL_BASED_OUTPATIENT_CLINIC_OR_DEPARTMENT_OTHER): Payer: Self-pay

## 2022-11-16 ENCOUNTER — Encounter (HOSPITAL_BASED_OUTPATIENT_CLINIC_OR_DEPARTMENT_OTHER): Payer: Self-pay | Admitting: Pharmacist

## 2022-11-16 ENCOUNTER — Other Ambulatory Visit (HOSPITAL_COMMUNITY): Payer: Self-pay

## 2022-11-16 MED ORDER — LISDEXAMFETAMINE DIMESYLATE 40 MG PO CHEW
1.0000 | CHEWABLE_TABLET | Freq: Every day | ORAL | 0 refills | Status: AC
  Filled 2022-11-16 – 2023-01-18 (×3): qty 30, 30d supply, fill #0

## 2022-11-17 ENCOUNTER — Other Ambulatory Visit: Payer: Self-pay

## 2022-11-17 ENCOUNTER — Other Ambulatory Visit (HOSPITAL_BASED_OUTPATIENT_CLINIC_OR_DEPARTMENT_OTHER): Payer: Self-pay

## 2022-11-18 ENCOUNTER — Other Ambulatory Visit (HOSPITAL_BASED_OUTPATIENT_CLINIC_OR_DEPARTMENT_OTHER): Payer: Self-pay

## 2022-12-15 ENCOUNTER — Other Ambulatory Visit (HOSPITAL_BASED_OUTPATIENT_CLINIC_OR_DEPARTMENT_OTHER): Payer: Self-pay

## 2022-12-15 MED ORDER — LISDEXAMFETAMINE DIMESYLATE 40 MG PO CAPS
40.0000 mg | ORAL_CAPSULE | Freq: Every day | ORAL | 0 refills | Status: DC
  Filled 2022-12-15 – 2022-12-18 (×2): qty 30, 30d supply, fill #0

## 2022-12-18 ENCOUNTER — Other Ambulatory Visit (HOSPITAL_BASED_OUTPATIENT_CLINIC_OR_DEPARTMENT_OTHER): Payer: Self-pay

## 2023-01-15 ENCOUNTER — Other Ambulatory Visit (HOSPITAL_BASED_OUTPATIENT_CLINIC_OR_DEPARTMENT_OTHER): Payer: Self-pay

## 2023-01-15 MED ORDER — LISDEXAMFETAMINE DIMESYLATE 40 MG PO CAPS
40.0000 mg | ORAL_CAPSULE | Freq: Every day | ORAL | 0 refills | Status: DC
  Filled 2023-01-15 – 2023-01-18 (×2): qty 30, 30d supply, fill #0

## 2023-01-18 ENCOUNTER — Other Ambulatory Visit (HOSPITAL_BASED_OUTPATIENT_CLINIC_OR_DEPARTMENT_OTHER): Payer: Self-pay

## 2023-01-20 ENCOUNTER — Other Ambulatory Visit (HOSPITAL_BASED_OUTPATIENT_CLINIC_OR_DEPARTMENT_OTHER): Payer: Self-pay

## 2023-01-22 ENCOUNTER — Other Ambulatory Visit: Payer: Self-pay

## 2023-01-22 ENCOUNTER — Other Ambulatory Visit (HOSPITAL_BASED_OUTPATIENT_CLINIC_OR_DEPARTMENT_OTHER): Payer: Self-pay

## 2023-02-19 ENCOUNTER — Other Ambulatory Visit (HOSPITAL_BASED_OUTPATIENT_CLINIC_OR_DEPARTMENT_OTHER): Payer: Self-pay

## 2023-02-19 MED ORDER — LISDEXAMFETAMINE DIMESYLATE 40 MG PO CAPS
40.0000 mg | ORAL_CAPSULE | Freq: Every day | ORAL | 0 refills | Status: DC
  Filled 2023-02-19: qty 30, 30d supply, fill #0

## 2023-03-23 ENCOUNTER — Other Ambulatory Visit (HOSPITAL_BASED_OUTPATIENT_CLINIC_OR_DEPARTMENT_OTHER): Payer: Self-pay

## 2023-03-23 MED ORDER — LISDEXAMFETAMINE DIMESYLATE 40 MG PO CAPS
40.0000 mg | ORAL_CAPSULE | Freq: Every day | ORAL | 0 refills | Status: DC
  Filled 2023-03-23: qty 30, 30d supply, fill #0

## 2023-04-21 ENCOUNTER — Other Ambulatory Visit (HOSPITAL_BASED_OUTPATIENT_CLINIC_OR_DEPARTMENT_OTHER): Payer: Self-pay

## 2023-04-21 ENCOUNTER — Other Ambulatory Visit: Payer: Self-pay

## 2023-04-21 MED ORDER — LISDEXAMFETAMINE DIMESYLATE 40 MG PO CAPS
40.0000 mg | ORAL_CAPSULE | Freq: Every day | ORAL | 0 refills | Status: DC
  Filled 2023-04-21: qty 30, 30d supply, fill #0

## 2023-05-19 ENCOUNTER — Other Ambulatory Visit (HOSPITAL_BASED_OUTPATIENT_CLINIC_OR_DEPARTMENT_OTHER): Payer: Self-pay

## 2023-05-19 MED ORDER — LISDEXAMFETAMINE DIMESYLATE 40 MG PO CAPS
40.0000 mg | ORAL_CAPSULE | Freq: Every day | ORAL | 0 refills | Status: DC
  Filled 2023-05-19: qty 30, 30d supply, fill #0

## 2023-06-22 ENCOUNTER — Other Ambulatory Visit (HOSPITAL_BASED_OUTPATIENT_CLINIC_OR_DEPARTMENT_OTHER): Payer: Self-pay

## 2023-06-22 MED ORDER — LISDEXAMFETAMINE DIMESYLATE 40 MG PO CAPS
40.0000 mg | ORAL_CAPSULE | Freq: Every day | ORAL | 0 refills | Status: DC
  Filled 2023-06-22: qty 30, 30d supply, fill #0

## 2023-07-23 ENCOUNTER — Other Ambulatory Visit (HOSPITAL_BASED_OUTPATIENT_CLINIC_OR_DEPARTMENT_OTHER): Payer: Self-pay

## 2023-07-23 MED ORDER — LISDEXAMFETAMINE DIMESYLATE 40 MG PO CAPS
40.0000 mg | ORAL_CAPSULE | Freq: Every day | ORAL | 0 refills | Status: DC
  Filled 2023-07-23: qty 30, 30d supply, fill #0

## 2023-08-20 ENCOUNTER — Other Ambulatory Visit (HOSPITAL_BASED_OUTPATIENT_CLINIC_OR_DEPARTMENT_OTHER): Payer: Self-pay

## 2023-08-20 MED ORDER — LISDEXAMFETAMINE DIMESYLATE 40 MG PO CAPS
40.0000 mg | ORAL_CAPSULE | Freq: Every day | ORAL | 0 refills | Status: DC
  Filled 2023-08-20: qty 30, 30d supply, fill #0

## 2023-08-26 ENCOUNTER — Other Ambulatory Visit (HOSPITAL_BASED_OUTPATIENT_CLINIC_OR_DEPARTMENT_OTHER): Payer: Self-pay

## 2023-09-22 ENCOUNTER — Other Ambulatory Visit (HOSPITAL_BASED_OUTPATIENT_CLINIC_OR_DEPARTMENT_OTHER): Payer: Self-pay

## 2023-09-22 MED ORDER — LISDEXAMFETAMINE DIMESYLATE 40 MG PO CAPS
40.0000 mg | ORAL_CAPSULE | Freq: Every day | ORAL | 0 refills | Status: DC
  Filled 2023-09-22: qty 30, 30d supply, fill #0

## 2023-10-21 ENCOUNTER — Other Ambulatory Visit (HOSPITAL_BASED_OUTPATIENT_CLINIC_OR_DEPARTMENT_OTHER): Payer: Self-pay

## 2023-10-21 MED ORDER — LISDEXAMFETAMINE DIMESYLATE 40 MG PO CAPS
40.0000 mg | ORAL_CAPSULE | Freq: Every day | ORAL | 0 refills | Status: DC
  Filled 2023-10-21: qty 30, 30d supply, fill #0

## 2023-11-22 ENCOUNTER — Other Ambulatory Visit (HOSPITAL_BASED_OUTPATIENT_CLINIC_OR_DEPARTMENT_OTHER): Payer: Self-pay

## 2023-11-22 MED ORDER — LISDEXAMFETAMINE DIMESYLATE 40 MG PO CAPS
40.0000 mg | ORAL_CAPSULE | Freq: Every day | ORAL | 0 refills | Status: DC
  Filled 2023-11-22: qty 30, 30d supply, fill #0

## 2023-12-22 ENCOUNTER — Other Ambulatory Visit (HOSPITAL_BASED_OUTPATIENT_CLINIC_OR_DEPARTMENT_OTHER): Payer: Self-pay

## 2023-12-22 MED ORDER — LISDEXAMFETAMINE DIMESYLATE 40 MG PO CAPS
40.0000 mg | ORAL_CAPSULE | Freq: Every day | ORAL | 0 refills | Status: DC
Start: 2023-12-22 — End: 2024-01-21
  Filled 2023-12-22: qty 30, 30d supply, fill #0

## 2024-01-21 ENCOUNTER — Other Ambulatory Visit (HOSPITAL_BASED_OUTPATIENT_CLINIC_OR_DEPARTMENT_OTHER): Payer: Self-pay

## 2024-01-21 MED ORDER — LISDEXAMFETAMINE DIMESYLATE 40 MG PO CAPS
40.0000 mg | ORAL_CAPSULE | Freq: Every day | ORAL | 0 refills | Status: DC
  Filled 2024-01-21: qty 30, 30d supply, fill #0

## 2024-02-16 ENCOUNTER — Other Ambulatory Visit (HOSPITAL_BASED_OUTPATIENT_CLINIC_OR_DEPARTMENT_OTHER): Payer: Self-pay

## 2024-02-16 MED ORDER — LISDEXAMFETAMINE DIMESYLATE 40 MG PO CAPS
40.0000 mg | ORAL_CAPSULE | Freq: Every day | ORAL | 0 refills | Status: DC
  Filled 2024-02-16 – 2024-02-19 (×2): qty 30, 30d supply, fill #0

## 2024-02-19 ENCOUNTER — Other Ambulatory Visit (HOSPITAL_BASED_OUTPATIENT_CLINIC_OR_DEPARTMENT_OTHER): Payer: Self-pay

## 2024-03-21 ENCOUNTER — Other Ambulatory Visit (HOSPITAL_BASED_OUTPATIENT_CLINIC_OR_DEPARTMENT_OTHER): Payer: Self-pay

## 2024-03-21 MED ORDER — LISDEXAMFETAMINE DIMESYLATE 40 MG PO CAPS
40.0000 mg | ORAL_CAPSULE | Freq: Every day | ORAL | 0 refills | Status: DC
  Filled 2024-03-21: qty 30, 30d supply, fill #0

## 2024-04-18 ENCOUNTER — Other Ambulatory Visit (HOSPITAL_BASED_OUTPATIENT_CLINIC_OR_DEPARTMENT_OTHER): Payer: Self-pay

## 2024-04-18 MED ORDER — LISDEXAMFETAMINE DIMESYLATE 40 MG PO CAPS
40.0000 mg | ORAL_CAPSULE | Freq: Every day | ORAL | 0 refills | Status: DC
  Filled 2024-04-18: qty 30, 30d supply, fill #0

## 2024-05-18 ENCOUNTER — Other Ambulatory Visit (HOSPITAL_BASED_OUTPATIENT_CLINIC_OR_DEPARTMENT_OTHER): Payer: Self-pay

## 2024-05-18 MED ORDER — LISDEXAMFETAMINE DIMESYLATE 40 MG PO CAPS
40.0000 mg | ORAL_CAPSULE | Freq: Every day | ORAL | 0 refills | Status: DC
  Filled 2024-05-22: qty 30, 30d supply, fill #0

## 2024-05-22 ENCOUNTER — Other Ambulatory Visit: Payer: Self-pay

## 2024-05-22 ENCOUNTER — Other Ambulatory Visit (HOSPITAL_BASED_OUTPATIENT_CLINIC_OR_DEPARTMENT_OTHER): Payer: Self-pay

## 2024-06-16 ENCOUNTER — Other Ambulatory Visit (HOSPITAL_BASED_OUTPATIENT_CLINIC_OR_DEPARTMENT_OTHER): Payer: Self-pay

## 2024-06-16 MED ORDER — LISDEXAMFETAMINE DIMESYLATE 40 MG PO CAPS
40.0000 mg | ORAL_CAPSULE | Freq: Every day | ORAL | 0 refills | Status: DC
  Filled 2024-06-19: qty 30, 30d supply, fill #0

## 2024-06-19 ENCOUNTER — Other Ambulatory Visit (HOSPITAL_BASED_OUTPATIENT_CLINIC_OR_DEPARTMENT_OTHER): Payer: Self-pay

## 2024-06-19 ENCOUNTER — Encounter (HOSPITAL_BASED_OUTPATIENT_CLINIC_OR_DEPARTMENT_OTHER): Payer: Self-pay

## 2024-06-20 ENCOUNTER — Other Ambulatory Visit: Payer: Self-pay

## 2024-06-21 ENCOUNTER — Other Ambulatory Visit (HOSPITAL_BASED_OUTPATIENT_CLINIC_OR_DEPARTMENT_OTHER): Payer: Self-pay

## 2024-07-18 ENCOUNTER — Other Ambulatory Visit (HOSPITAL_BASED_OUTPATIENT_CLINIC_OR_DEPARTMENT_OTHER): Payer: Self-pay

## 2024-07-18 MED ORDER — LISDEXAMFETAMINE DIMESYLATE 40 MG PO CAPS
40.0000 mg | ORAL_CAPSULE | Freq: Every day | ORAL | 0 refills | Status: DC
  Filled 2024-07-18: qty 30, 30d supply, fill #0

## 2024-08-15 ENCOUNTER — Other Ambulatory Visit (HOSPITAL_BASED_OUTPATIENT_CLINIC_OR_DEPARTMENT_OTHER): Payer: Self-pay

## 2024-08-15 MED ORDER — LISDEXAMFETAMINE DIMESYLATE 40 MG PO CAPS
40.0000 mg | ORAL_CAPSULE | Freq: Every day | ORAL | 0 refills | Status: DC
  Filled 2024-08-16: qty 30, 30d supply, fill #0

## 2024-08-16 ENCOUNTER — Other Ambulatory Visit (HOSPITAL_BASED_OUTPATIENT_CLINIC_OR_DEPARTMENT_OTHER): Payer: Self-pay

## 2024-09-13 ENCOUNTER — Other Ambulatory Visit (HOSPITAL_BASED_OUTPATIENT_CLINIC_OR_DEPARTMENT_OTHER): Payer: Self-pay

## 2024-09-13 MED ORDER — LISDEXAMFETAMINE DIMESYLATE 40 MG PO CAPS
40.0000 mg | ORAL_CAPSULE | Freq: Every day | ORAL | 0 refills | Status: AC
  Filled 2024-09-15: qty 30, 30d supply, fill #0

## 2024-09-15 ENCOUNTER — Other Ambulatory Visit: Payer: Self-pay

## 2024-09-15 ENCOUNTER — Other Ambulatory Visit (HOSPITAL_BASED_OUTPATIENT_CLINIC_OR_DEPARTMENT_OTHER): Payer: Self-pay
